# Patient Record
Sex: Male | Born: 1960 | Race: Black or African American | Hispanic: No | Marital: Single | Smoking: Former smoker
Health system: Southern US, Community
[De-identification: ages and names within clinical notes are randomized; demographics above are authoritative.]

## PROBLEM LIST (undated history)

## (undated) DIAGNOSIS — R269 Unspecified abnormalities of gait and mobility: Secondary | ICD-10-CM

## (undated) DIAGNOSIS — R569 Unspecified convulsions: Secondary | ICD-10-CM

## (undated) DIAGNOSIS — F1910 Other psychoactive substance abuse, uncomplicated: Secondary | ICD-10-CM

## (undated) DIAGNOSIS — I10 Essential (primary) hypertension: Secondary | ICD-10-CM

## (undated) DIAGNOSIS — K220 Achalasia of cardia: Secondary | ICD-10-CM

## (undated) DIAGNOSIS — G40109 Localization-related (focal) (partial) symptomatic epilepsy and epileptic syndromes with simple partial seizures, not intractable, without status epilepticus: Secondary | ICD-10-CM

## (undated) DIAGNOSIS — R1311 Dysphagia, oral phase: Secondary | ICD-10-CM

## (undated) DIAGNOSIS — B019 Varicella without complication: Secondary | ICD-10-CM

## (undated) DIAGNOSIS — Z21 Asymptomatic human immunodeficiency virus [HIV] infection status: Secondary | ICD-10-CM

## (undated) DIAGNOSIS — N62 Hypertrophy of breast: Secondary | ICD-10-CM

## (undated) DIAGNOSIS — Z8709 Personal history of other diseases of the respiratory system: Secondary | ICD-10-CM

## (undated) DIAGNOSIS — J309 Allergic rhinitis, unspecified: Secondary | ICD-10-CM

## (undated) DIAGNOSIS — S0990XA Unspecified injury of head, initial encounter: Secondary | ICD-10-CM

## (undated) DIAGNOSIS — E785 Hyperlipidemia, unspecified: Secondary | ICD-10-CM

## (undated) DIAGNOSIS — K219 Gastro-esophageal reflux disease without esophagitis: Secondary | ICD-10-CM

## (undated) DIAGNOSIS — J302 Other seasonal allergic rhinitis: Secondary | ICD-10-CM

## (undated) DIAGNOSIS — Z9889 Other specified postprocedural states: Secondary | ICD-10-CM

## (undated) DIAGNOSIS — S72009A Fracture of unspecified part of neck of unspecified femur, initial encounter for closed fracture: Secondary | ICD-10-CM

## (undated) DIAGNOSIS — B20 Human immunodeficiency virus [HIV] disease: Secondary | ICD-10-CM

## (undated) HISTORY — DX: Unspecified convulsions: R56.9

## (undated) HISTORY — DX: Unspecified injury of head, initial encounter: S09.90XA

## (undated) HISTORY — DX: Unspecified abnormalities of gait and mobility: R26.9

## (undated) HISTORY — DX: Gastro-esophageal reflux disease without esophagitis: K21.9

## (undated) HISTORY — DX: Localization-related (focal) (partial) symptomatic epilepsy and epileptic syndromes with simple partial seizures, not intractable, without status epilepticus: G40.109

## (undated) HISTORY — DX: Human immunodeficiency virus (HIV) disease: B20

## (undated) HISTORY — DX: Asymptomatic human immunodeficiency virus (hiv) infection status: Z21

## (undated) HISTORY — DX: Other psychoactive substance abuse, uncomplicated: F19.10

## (undated) HISTORY — DX: Varicella without complication: B01.9

## (undated) HISTORY — DX: Hyperlipidemia, unspecified: E78.5

## (undated) HISTORY — DX: Essential (primary) hypertension: I10

## (undated) HISTORY — DX: Dysphagia, oral phase: R13.11

## (undated) HISTORY — PX: COLONOSCOPY: SHX174

## (undated) HISTORY — DX: Other seasonal allergic rhinitis: J30.2

## (undated) HISTORY — PX: OTHER SURGICAL HISTORY: SHX169

## (undated) HISTORY — DX: Other specified postprocedural states: Z98.89

## (undated) HISTORY — DX: Fracture of unspecified part of neck of unspecified femur, initial encounter for closed fracture: S72.009A

## (undated) HISTORY — DX: Achalasia of cardia: K22.0

## (undated) HISTORY — DX: Personal history of other diseases of the respiratory system: Z87.09

## (undated) HISTORY — DX: Allergic rhinitis, unspecified: J30.9

## (undated) HISTORY — DX: Hypertrophy of breast: N62

---

## 1991-03-22 ENCOUNTER — Encounter (INDEPENDENT_AMBULATORY_CARE_PROVIDER_SITE_OTHER): Payer: Self-pay | Admitting: *Deleted

## 1991-03-22 LAB — CONVERTED CEMR LAB
CD4 Count: 916 microliters
CD4 T Cell Abs: 916

## 1998-04-04 ENCOUNTER — Encounter: Admission: RE | Admit: 1998-04-04 | Discharge: 1998-04-04 | Payer: Self-pay | Admitting: Internal Medicine

## 1998-04-23 ENCOUNTER — Encounter: Admission: RE | Admit: 1998-04-23 | Discharge: 1998-04-23 | Payer: Self-pay | Admitting: Internal Medicine

## 1998-07-05 ENCOUNTER — Encounter: Admission: RE | Admit: 1998-07-05 | Discharge: 1998-07-05 | Payer: Self-pay | Admitting: Internal Medicine

## 1998-07-23 ENCOUNTER — Encounter: Admission: RE | Admit: 1998-07-23 | Discharge: 1998-07-23 | Payer: Self-pay | Admitting: Internal Medicine

## 1998-08-28 ENCOUNTER — Encounter: Admission: RE | Admit: 1998-08-28 | Discharge: 1998-11-26 | Payer: Self-pay | Admitting: Dentistry

## 1998-10-08 ENCOUNTER — Ambulatory Visit (HOSPITAL_COMMUNITY): Admission: RE | Admit: 1998-10-08 | Discharge: 1998-10-08 | Payer: Self-pay | Admitting: Internal Medicine

## 1998-11-12 ENCOUNTER — Encounter: Admission: RE | Admit: 1998-11-12 | Discharge: 1998-11-12 | Payer: Self-pay | Admitting: Internal Medicine

## 1999-01-22 ENCOUNTER — Ambulatory Visit (HOSPITAL_COMMUNITY): Admission: RE | Admit: 1999-01-22 | Discharge: 1999-01-22 | Payer: Self-pay | Admitting: Internal Medicine

## 1999-01-22 ENCOUNTER — Encounter: Admission: RE | Admit: 1999-01-22 | Discharge: 1999-01-22 | Payer: Self-pay | Admitting: Internal Medicine

## 1999-02-05 ENCOUNTER — Encounter: Admission: RE | Admit: 1999-02-05 | Discharge: 1999-02-05 | Payer: Self-pay | Admitting: Internal Medicine

## 1999-04-22 ENCOUNTER — Ambulatory Visit (HOSPITAL_COMMUNITY): Admission: RE | Admit: 1999-04-22 | Discharge: 1999-04-22 | Payer: Self-pay | Admitting: Internal Medicine

## 1999-05-06 ENCOUNTER — Encounter: Admission: RE | Admit: 1999-05-06 | Discharge: 1999-05-06 | Payer: Self-pay | Admitting: Internal Medicine

## 1999-06-14 ENCOUNTER — Encounter (HOSPITAL_COMMUNITY): Admission: RE | Admit: 1999-06-14 | Discharge: 1999-07-22 | Payer: Self-pay | Admitting: Dentistry

## 1999-07-15 ENCOUNTER — Ambulatory Visit (HOSPITAL_COMMUNITY): Admission: RE | Admit: 1999-07-15 | Discharge: 1999-07-15 | Payer: Self-pay | Admitting: Internal Medicine

## 1999-07-15 ENCOUNTER — Encounter: Admission: RE | Admit: 1999-07-15 | Discharge: 1999-07-15 | Payer: Self-pay | Admitting: Internal Medicine

## 1999-08-05 ENCOUNTER — Encounter: Admission: RE | Admit: 1999-08-05 | Discharge: 1999-08-05 | Payer: Self-pay | Admitting: Internal Medicine

## 1999-09-03 ENCOUNTER — Encounter: Admission: RE | Admit: 1999-09-03 | Discharge: 1999-09-03 | Payer: Self-pay | Admitting: Internal Medicine

## 1999-09-17 ENCOUNTER — Encounter: Admission: RE | Admit: 1999-09-17 | Discharge: 1999-09-17 | Payer: Self-pay | Admitting: Internal Medicine

## 2000-01-08 ENCOUNTER — Encounter: Admission: RE | Admit: 2000-01-08 | Discharge: 2000-01-08 | Payer: Self-pay | Admitting: Internal Medicine

## 2000-01-08 ENCOUNTER — Ambulatory Visit (HOSPITAL_COMMUNITY): Admission: RE | Admit: 2000-01-08 | Discharge: 2000-01-08 | Payer: Self-pay | Admitting: Internal Medicine

## 2000-01-27 ENCOUNTER — Encounter: Admission: RE | Admit: 2000-01-27 | Discharge: 2000-01-27 | Payer: Self-pay | Admitting: Internal Medicine

## 2000-07-09 ENCOUNTER — Encounter: Admission: RE | Admit: 2000-07-09 | Discharge: 2000-07-09 | Payer: Self-pay | Admitting: Internal Medicine

## 2000-07-09 ENCOUNTER — Ambulatory Visit (HOSPITAL_COMMUNITY): Admission: RE | Admit: 2000-07-09 | Discharge: 2000-07-09 | Payer: Self-pay | Admitting: Internal Medicine

## 2000-07-27 ENCOUNTER — Encounter: Admission: RE | Admit: 2000-07-27 | Discharge: 2000-07-27 | Payer: Self-pay | Admitting: Internal Medicine

## 2000-12-23 ENCOUNTER — Encounter: Admission: RE | Admit: 2000-12-23 | Discharge: 2000-12-23 | Payer: Self-pay | Admitting: Internal Medicine

## 2000-12-23 ENCOUNTER — Ambulatory Visit (HOSPITAL_COMMUNITY): Admission: RE | Admit: 2000-12-23 | Discharge: 2000-12-23 | Payer: Self-pay | Admitting: Internal Medicine

## 2001-01-07 ENCOUNTER — Encounter: Admission: RE | Admit: 2001-01-07 | Discharge: 2001-01-07 | Payer: Self-pay | Admitting: Internal Medicine

## 2001-05-03 ENCOUNTER — Encounter: Admission: RE | Admit: 2001-05-03 | Discharge: 2001-05-03 | Payer: Self-pay | Admitting: Internal Medicine

## 2001-05-03 ENCOUNTER — Ambulatory Visit (HOSPITAL_COMMUNITY): Admission: RE | Admit: 2001-05-03 | Discharge: 2001-05-03 | Payer: Self-pay | Admitting: Internal Medicine

## 2001-05-18 ENCOUNTER — Encounter: Admission: RE | Admit: 2001-05-18 | Discharge: 2001-05-18 | Payer: Self-pay | Admitting: Internal Medicine

## 2001-08-17 ENCOUNTER — Encounter: Admission: RE | Admit: 2001-08-17 | Discharge: 2001-08-17 | Payer: Self-pay | Admitting: Internal Medicine

## 2001-08-17 ENCOUNTER — Ambulatory Visit (HOSPITAL_COMMUNITY): Admission: RE | Admit: 2001-08-17 | Discharge: 2001-08-17 | Payer: Self-pay | Admitting: Internal Medicine

## 2001-08-30 ENCOUNTER — Encounter: Admission: RE | Admit: 2001-08-30 | Discharge: 2001-08-30 | Payer: Self-pay | Admitting: Internal Medicine

## 2002-01-04 ENCOUNTER — Encounter: Payer: Self-pay | Admitting: Emergency Medicine

## 2002-01-04 ENCOUNTER — Inpatient Hospital Stay (HOSPITAL_COMMUNITY): Admission: AC | Admit: 2002-01-04 | Discharge: 2002-01-11 | Payer: Self-pay

## 2002-01-06 ENCOUNTER — Encounter (HOSPITAL_COMMUNITY): Payer: Self-pay | Admitting: Dentistry

## 2002-01-07 ENCOUNTER — Encounter: Payer: Self-pay | Admitting: Specialist

## 2002-04-11 ENCOUNTER — Ambulatory Visit (HOSPITAL_COMMUNITY): Admission: RE | Admit: 2002-04-11 | Discharge: 2002-04-11 | Payer: Self-pay | Admitting: Internal Medicine

## 2002-04-11 ENCOUNTER — Encounter: Admission: RE | Admit: 2002-04-11 | Discharge: 2002-04-11 | Payer: Self-pay | Admitting: Internal Medicine

## 2002-04-26 ENCOUNTER — Encounter: Admission: RE | Admit: 2002-04-26 | Discharge: 2002-04-26 | Payer: Self-pay | Admitting: Internal Medicine

## 2002-04-28 ENCOUNTER — Encounter (HOSPITAL_COMMUNITY): Admission: RE | Admit: 2002-04-28 | Discharge: 2002-07-27 | Payer: Self-pay | Admitting: Dentistry

## 2002-09-13 ENCOUNTER — Encounter: Admission: RE | Admit: 2002-09-13 | Discharge: 2002-09-13 | Payer: Self-pay | Admitting: Internal Medicine

## 2002-09-13 ENCOUNTER — Ambulatory Visit (HOSPITAL_COMMUNITY): Admission: RE | Admit: 2002-09-13 | Discharge: 2002-09-13 | Payer: Self-pay | Admitting: Internal Medicine

## 2002-09-27 ENCOUNTER — Encounter: Admission: RE | Admit: 2002-09-27 | Discharge: 2002-09-27 | Payer: Self-pay | Admitting: Internal Medicine

## 2003-01-12 ENCOUNTER — Encounter: Payer: Self-pay | Admitting: Emergency Medicine

## 2003-01-12 ENCOUNTER — Encounter: Payer: Self-pay | Admitting: Orthopedic Surgery

## 2003-01-12 ENCOUNTER — Inpatient Hospital Stay (HOSPITAL_COMMUNITY): Admission: EM | Admit: 2003-01-12 | Discharge: 2003-01-17 | Payer: Self-pay | Admitting: Emergency Medicine

## 2003-01-17 ENCOUNTER — Inpatient Hospital Stay (HOSPITAL_COMMUNITY)
Admission: RE | Admit: 2003-01-17 | Discharge: 2003-01-27 | Payer: Self-pay | Admitting: Physical Medicine & Rehabilitation

## 2003-03-03 ENCOUNTER — Encounter: Admission: RE | Admit: 2003-03-03 | Discharge: 2003-03-03 | Payer: Self-pay | Admitting: Internal Medicine

## 2003-03-21 ENCOUNTER — Encounter: Admission: RE | Admit: 2003-03-21 | Discharge: 2003-03-21 | Payer: Self-pay | Admitting: Infectious Diseases

## 2003-08-09 ENCOUNTER — Encounter: Admission: RE | Admit: 2003-08-09 | Discharge: 2003-08-09 | Payer: Self-pay | Admitting: Internal Medicine

## 2003-08-09 ENCOUNTER — Ambulatory Visit (HOSPITAL_COMMUNITY): Admission: RE | Admit: 2003-08-09 | Discharge: 2003-08-09 | Payer: Self-pay | Admitting: Internal Medicine

## 2003-08-09 ENCOUNTER — Encounter: Payer: Self-pay | Admitting: Internal Medicine

## 2003-09-05 ENCOUNTER — Encounter: Admission: RE | Admit: 2003-09-05 | Discharge: 2003-09-05 | Payer: Self-pay | Admitting: Internal Medicine

## 2003-09-19 ENCOUNTER — Encounter: Admission: RE | Admit: 2003-09-19 | Discharge: 2003-09-19 | Payer: Self-pay | Admitting: Gastroenterology

## 2003-09-19 ENCOUNTER — Encounter: Payer: Self-pay | Admitting: Gastroenterology

## 2003-11-28 ENCOUNTER — Ambulatory Visit (HOSPITAL_COMMUNITY): Admission: RE | Admit: 2003-11-28 | Discharge: 2003-11-28 | Payer: Self-pay | Admitting: Gastroenterology

## 2004-01-03 ENCOUNTER — Ambulatory Visit (HOSPITAL_COMMUNITY): Admission: RE | Admit: 2004-01-03 | Discharge: 2004-01-03 | Payer: Self-pay | Admitting: Gastroenterology

## 2004-02-06 ENCOUNTER — Ambulatory Visit (HOSPITAL_COMMUNITY): Admission: RE | Admit: 2004-02-06 | Discharge: 2004-02-06 | Payer: Self-pay | Admitting: Internal Medicine

## 2004-02-06 ENCOUNTER — Encounter: Admission: RE | Admit: 2004-02-06 | Discharge: 2004-02-06 | Payer: Self-pay | Admitting: Internal Medicine

## 2004-02-20 ENCOUNTER — Encounter: Admission: RE | Admit: 2004-02-20 | Discharge: 2004-02-20 | Payer: Self-pay | Admitting: Internal Medicine

## 2004-08-05 ENCOUNTER — Ambulatory Visit (HOSPITAL_COMMUNITY): Admission: RE | Admit: 2004-08-05 | Discharge: 2004-08-05 | Payer: Self-pay | Admitting: Internal Medicine

## 2004-08-05 ENCOUNTER — Encounter: Admission: RE | Admit: 2004-08-05 | Discharge: 2004-08-05 | Payer: Self-pay | Admitting: Internal Medicine

## 2004-08-22 ENCOUNTER — Ambulatory Visit: Payer: Self-pay | Admitting: Internal Medicine

## 2005-02-11 ENCOUNTER — Ambulatory Visit (HOSPITAL_COMMUNITY): Admission: RE | Admit: 2005-02-11 | Discharge: 2005-02-11 | Payer: Self-pay | Admitting: Internal Medicine

## 2005-02-11 ENCOUNTER — Ambulatory Visit: Payer: Self-pay | Admitting: Infectious Diseases

## 2005-02-25 ENCOUNTER — Ambulatory Visit: Payer: Self-pay | Admitting: Internal Medicine

## 2005-08-29 ENCOUNTER — Ambulatory Visit: Payer: Self-pay | Admitting: Internal Medicine

## 2005-08-29 ENCOUNTER — Ambulatory Visit (HOSPITAL_COMMUNITY): Admission: RE | Admit: 2005-08-29 | Discharge: 2005-08-29 | Payer: Self-pay | Admitting: Internal Medicine

## 2005-08-29 ENCOUNTER — Encounter (INDEPENDENT_AMBULATORY_CARE_PROVIDER_SITE_OTHER): Payer: Self-pay | Admitting: *Deleted

## 2005-08-29 LAB — CONVERTED CEMR LAB
CD4 Count: 730 uL
HIV 1 RNA Quant: 10600 {copies}/mL

## 2005-09-15 ENCOUNTER — Ambulatory Visit: Payer: Self-pay | Admitting: Internal Medicine

## 2006-03-04 ENCOUNTER — Encounter (INDEPENDENT_AMBULATORY_CARE_PROVIDER_SITE_OTHER): Payer: Self-pay | Admitting: *Deleted

## 2006-03-04 ENCOUNTER — Encounter: Admission: RE | Admit: 2006-03-04 | Discharge: 2006-03-04 | Payer: Self-pay | Admitting: Internal Medicine

## 2006-03-04 ENCOUNTER — Ambulatory Visit: Payer: Self-pay | Admitting: Internal Medicine

## 2006-03-04 LAB — CONVERTED CEMR LAB
CD4 Count: 630 microliters
HIV 1 RNA Quant: 11600 copies/mL

## 2006-03-19 ENCOUNTER — Ambulatory Visit: Payer: Self-pay | Admitting: Internal Medicine

## 2006-08-26 ENCOUNTER — Ambulatory Visit: Payer: Self-pay | Admitting: Internal Medicine

## 2006-08-26 ENCOUNTER — Encounter: Admission: RE | Admit: 2006-08-26 | Discharge: 2006-08-26 | Payer: Self-pay | Admitting: Internal Medicine

## 2006-08-26 ENCOUNTER — Encounter (INDEPENDENT_AMBULATORY_CARE_PROVIDER_SITE_OTHER): Payer: Self-pay | Admitting: *Deleted

## 2006-08-26 LAB — CONVERTED CEMR LAB
CD4 Count: 480 microliters
HIV 1 RNA Quant: 11200 copies/mL

## 2006-09-09 ENCOUNTER — Ambulatory Visit: Payer: Self-pay | Admitting: Internal Medicine

## 2007-02-05 DIAGNOSIS — G8190 Hemiplegia, unspecified affecting unspecified side: Secondary | ICD-10-CM | POA: Insufficient documentation

## 2007-02-05 DIAGNOSIS — K220 Achalasia of cardia: Secondary | ICD-10-CM

## 2007-02-05 DIAGNOSIS — B20 Human immunodeficiency virus [HIV] disease: Secondary | ICD-10-CM | POA: Insufficient documentation

## 2007-02-05 DIAGNOSIS — J309 Allergic rhinitis, unspecified: Secondary | ICD-10-CM

## 2007-02-05 DIAGNOSIS — R569 Unspecified convulsions: Secondary | ICD-10-CM

## 2007-02-05 DIAGNOSIS — S72009A Fracture of unspecified part of neck of unspecified femur, initial encounter for closed fracture: Secondary | ICD-10-CM

## 2007-02-05 DIAGNOSIS — F1010 Alcohol abuse, uncomplicated: Secondary | ICD-10-CM | POA: Insufficient documentation

## 2007-02-05 DIAGNOSIS — E785 Hyperlipidemia, unspecified: Secondary | ICD-10-CM

## 2007-02-05 DIAGNOSIS — R1311 Dysphagia, oral phase: Secondary | ICD-10-CM

## 2007-02-05 DIAGNOSIS — Z8709 Personal history of other diseases of the respiratory system: Secondary | ICD-10-CM

## 2007-02-05 DIAGNOSIS — I10 Essential (primary) hypertension: Secondary | ICD-10-CM

## 2007-02-05 DIAGNOSIS — N62 Hypertrophy of breast: Secondary | ICD-10-CM

## 2007-02-05 DIAGNOSIS — B029 Zoster without complications: Secondary | ICD-10-CM | POA: Insufficient documentation

## 2007-02-05 DIAGNOSIS — F17200 Nicotine dependence, unspecified, uncomplicated: Secondary | ICD-10-CM

## 2007-02-05 HISTORY — DX: Fracture of unspecified part of neck of unspecified femur, initial encounter for closed fracture: S72.009A

## 2007-02-05 HISTORY — DX: Dysphagia, oral phase: R13.11

## 2007-02-05 HISTORY — DX: Achalasia of cardia: K22.0

## 2007-02-05 HISTORY — DX: Hypertrophy of breast: N62

## 2007-02-05 HISTORY — DX: Allergic rhinitis, unspecified: J30.9

## 2007-02-05 HISTORY — DX: Personal history of other diseases of the respiratory system: Z87.09

## 2007-02-08 ENCOUNTER — Encounter: Payer: Self-pay | Admitting: Internal Medicine

## 2007-02-10 ENCOUNTER — Encounter: Admission: RE | Admit: 2007-02-10 | Discharge: 2007-02-10 | Payer: Self-pay | Admitting: Internal Medicine

## 2007-02-10 ENCOUNTER — Ambulatory Visit: Payer: Self-pay | Admitting: Internal Medicine

## 2007-02-10 LAB — CONVERTED CEMR LAB
ALT: 33 units/L (ref 0–53)
AST: 27 units/L (ref 0–37)
Albumin: 4 g/dL (ref 3.5–5.2)
Alkaline Phosphatase: 106 units/L (ref 39–117)
BUN: 11 mg/dL (ref 6–23)
Basophils Absolute: 0 10*3/uL (ref 0.0–0.1)
Basophils Relative: 1 % (ref 0–1)
CD4 % Helper T Cell: 24 %
CD4 Count: 610 microliters
CO2: 27 meq/L (ref 19–32)
Calcium: 8.7 mg/dL (ref 8.4–10.5)
Chloride: 102 meq/L (ref 96–112)
Cholesterol: 131 mg/dL (ref 0–200)
Creatinine, Ser: 0.86 mg/dL (ref 0.40–1.50)
Eosinophils Absolute: 0 10*3/uL (ref 0.0–0.7)
Eosinophils Relative: 0 % (ref 0–5)
Glucose, Bld: 196 mg/dL — ABNORMAL HIGH (ref 70–99)
HCT: 42.7 % (ref 39.0–52.0)
HDL: 31 mg/dL — ABNORMAL LOW (ref >39)
HIV 1 RNA Quant: 3360 copies/mL — ABNORMAL HIGH (ref <50)
HIV-1 RNA Quant, Log: 3.53 — ABNORMAL HIGH (ref <1.70)
Hemoglobin: 13.4 g/dL (ref 13.0–17.0)
LDL Cholesterol: 35 mg/dL (ref 0–99)
Lymphocytes Relative: 44 % (ref 12–46)
Lymphs Abs: 2.7 10*3/uL (ref 0.7–3.3)
MCHC: 31.4 g/dL (ref 30.0–36.0)
MCV: 87.3 fL (ref 78.0–100.0)
Monocytes Absolute: 0.6 10*3/uL (ref 0.2–0.7)
Monocytes Relative: 9 % (ref 3–11)
Neutro Abs: 2.9 10*3/uL (ref 1.7–7.7)
Neutrophils Relative %: 47 % (ref 43–77)
Platelets: 209 10*3/uL (ref 150–400)
Potassium: 3.7 meq/L (ref 3.5–5.3)
RBC: 4.89 M/uL (ref 4.22–5.81)
RDW: 14.7 % — ABNORMAL HIGH (ref 11.5–14.0)
Sodium: 137 meq/L (ref 135–145)
Total Bilirubin: 0.3 mg/dL (ref 0.3–1.2)
Total CHOL/HDL Ratio: 4.2
Total Protein: 8.7 g/dL — ABNORMAL HIGH (ref 6.0–8.3)
Triglycerides: 323 mg/dL — ABNORMAL HIGH (ref <150)
VLDL: 65 mg/dL — ABNORMAL HIGH (ref 0–40)
WBC: 6.3 10*3/uL (ref 4.0–10.5)

## 2007-02-15 ENCOUNTER — Encounter (INDEPENDENT_AMBULATORY_CARE_PROVIDER_SITE_OTHER): Payer: Self-pay | Admitting: *Deleted

## 2007-02-15 LAB — CONVERTED CEMR LAB

## 2007-02-25 ENCOUNTER — Ambulatory Visit: Payer: Self-pay | Admitting: Internal Medicine

## 2007-02-25 LAB — CONVERTED CEMR LAB: Hgb A1c MFr Bld: 6.1 %

## 2007-02-28 ENCOUNTER — Encounter (INDEPENDENT_AMBULATORY_CARE_PROVIDER_SITE_OTHER): Payer: Self-pay | Admitting: *Deleted

## 2007-03-02 ENCOUNTER — Encounter (INDEPENDENT_AMBULATORY_CARE_PROVIDER_SITE_OTHER): Payer: Self-pay | Admitting: *Deleted

## 2007-04-06 ENCOUNTER — Encounter: Payer: Self-pay | Admitting: Internal Medicine

## 2007-04-12 ENCOUNTER — Telehealth: Payer: Self-pay | Admitting: Internal Medicine

## 2007-05-03 ENCOUNTER — Encounter: Payer: Self-pay | Admitting: Internal Medicine

## 2007-05-04 ENCOUNTER — Ambulatory Visit: Payer: Self-pay | Admitting: Internal Medicine

## 2007-05-05 ENCOUNTER — Encounter: Payer: Self-pay | Admitting: Internal Medicine

## 2007-05-10 ENCOUNTER — Telehealth: Payer: Self-pay | Admitting: Internal Medicine

## 2007-09-03 ENCOUNTER — Ambulatory Visit: Payer: Self-pay | Admitting: Internal Medicine

## 2007-09-03 ENCOUNTER — Encounter: Admission: RE | Admit: 2007-09-03 | Discharge: 2007-09-03 | Payer: Self-pay | Admitting: Internal Medicine

## 2007-09-03 LAB — CONVERTED CEMR LAB
HIV 1 RNA Quant: 12500 copies/mL — ABNORMAL HIGH (ref <50)
HIV-1 RNA Quant, Log: 4.1 — ABNORMAL HIGH (ref <1.70)

## 2007-09-28 ENCOUNTER — Ambulatory Visit: Payer: Self-pay | Admitting: Internal Medicine

## 2007-09-28 DIAGNOSIS — K219 Gastro-esophageal reflux disease without esophagitis: Secondary | ICD-10-CM

## 2007-09-28 HISTORY — DX: Gastro-esophageal reflux disease without esophagitis: K21.9

## 2007-09-28 LAB — CONVERTED CEMR LAB
BUN: 15 mg/dL (ref 6–23)
CO2: 27 meq/L (ref 19–32)
Calcium: 8.9 mg/dL (ref 8.4–10.5)
Chloride: 102 meq/L (ref 96–112)
Creatinine, Ser: 0.88 mg/dL (ref 0.40–1.50)
Glucose, Bld: 104 mg/dL — ABNORMAL HIGH (ref 70–99)
Hgb A1c MFr Bld: 5.8 %
Potassium: 3.7 meq/L (ref 3.5–5.3)
Sodium: 138 meq/L (ref 135–145)

## 2007-12-21 ENCOUNTER — Encounter: Payer: Self-pay | Admitting: Internal Medicine

## 2008-03-09 ENCOUNTER — Ambulatory Visit: Payer: Self-pay | Admitting: Internal Medicine

## 2008-03-09 ENCOUNTER — Encounter: Admission: RE | Admit: 2008-03-09 | Discharge: 2008-03-09 | Payer: Self-pay | Admitting: Internal Medicine

## 2008-03-09 LAB — CONVERTED CEMR LAB
ALT: 29 units/L (ref 0–53)
AST: 27 units/L (ref 0–37)
Albumin: 3.8 g/dL (ref 3.5–5.2)
Alkaline Phosphatase: 88 units/L (ref 39–117)
BUN: 15 mg/dL (ref 6–23)
CO2: 25 meq/L (ref 19–32)
Calcium: 9 mg/dL (ref 8.4–10.5)
Chloride: 104 meq/L (ref 96–112)
Cholesterol: 141 mg/dL (ref 0–200)
Creatinine, Ser: 0.88 mg/dL (ref 0.40–1.50)
Glucose, Bld: 92 mg/dL (ref 70–99)
HCT: 41.2 % (ref 39.0–52.0)
HDL: 36 mg/dL — ABNORMAL LOW (ref >39)
HIV 1 RNA Quant: 7800 copies/mL — ABNORMAL HIGH (ref <50)
HIV-1 RNA Quant, Log: 3.89 — ABNORMAL HIGH (ref <1.70)
Hemoglobin: 13 g/dL (ref 13.0–17.0)
LDL Cholesterol: 59 mg/dL (ref 0–99)
MCHC: 31.6 g/dL (ref 30.0–36.0)
MCV: 86.9 fL (ref 78.0–100.0)
Platelets: 209 10*3/uL (ref 150–400)
Potassium: 3.7 meq/L (ref 3.5–5.3)
RBC: 4.74 M/uL (ref 4.22–5.81)
RDW: 14.7 % (ref 11.5–15.5)
Sodium: 139 meq/L (ref 135–145)
Total Bilirubin: 0.3 mg/dL (ref 0.3–1.2)
Total CHOL/HDL Ratio: 3.9
Total Protein: 8.7 g/dL — ABNORMAL HIGH (ref 6.0–8.3)
Triglycerides: 229 mg/dL — ABNORMAL HIGH (ref <150)
VLDL: 46 mg/dL — ABNORMAL HIGH (ref 0–40)
WBC: 7.7 10*3/uL (ref 4.0–10.5)

## 2008-03-22 ENCOUNTER — Ambulatory Visit: Payer: Self-pay | Admitting: Internal Medicine

## 2008-09-04 ENCOUNTER — Ambulatory Visit: Payer: Self-pay | Admitting: Internal Medicine

## 2008-09-04 LAB — CONVERTED CEMR LAB
BUN: 18 mg/dL (ref 6–23)
CO2: 25 meq/L (ref 19–32)
Calcium: 9.2 mg/dL (ref 8.4–10.5)
Chloride: 103 meq/L (ref 96–112)
Creatinine, Ser: 0.89 mg/dL (ref 0.40–1.50)
Glucose, Bld: 83 mg/dL (ref 70–99)
HIV 1 RNA Quant: 8410 copies/mL — ABNORMAL HIGH (ref <50)
HIV-1 RNA Quant, Log: 3.92 — ABNORMAL HIGH (ref <1.70)
Potassium: 3.9 meq/L (ref 3.5–5.3)
Sodium: 138 meq/L (ref 135–145)

## 2008-09-19 ENCOUNTER — Ambulatory Visit: Payer: Self-pay | Admitting: Internal Medicine

## 2009-01-02 ENCOUNTER — Encounter: Payer: Self-pay | Admitting: Internal Medicine

## 2009-01-17 ENCOUNTER — Telehealth (INDEPENDENT_AMBULATORY_CARE_PROVIDER_SITE_OTHER): Payer: Self-pay | Admitting: *Deleted

## 2009-01-19 ENCOUNTER — Encounter: Payer: Self-pay | Admitting: Internal Medicine

## 2009-01-19 ENCOUNTER — Ambulatory Visit: Payer: Self-pay | Admitting: Internal Medicine

## 2009-02-12 ENCOUNTER — Emergency Department (HOSPITAL_COMMUNITY): Admission: EM | Admit: 2009-02-12 | Discharge: 2009-02-12 | Payer: Self-pay | Admitting: Emergency Medicine

## 2009-02-28 ENCOUNTER — Encounter: Admission: RE | Admit: 2009-02-28 | Discharge: 2009-02-28 | Payer: Self-pay | Admitting: Gastroenterology

## 2009-02-28 ENCOUNTER — Encounter: Payer: Self-pay | Admitting: Internal Medicine

## 2009-03-05 ENCOUNTER — Ambulatory Visit: Payer: Self-pay | Admitting: Internal Medicine

## 2009-03-05 ENCOUNTER — Telehealth: Payer: Self-pay | Admitting: Internal Medicine

## 2009-03-05 LAB — CONVERTED CEMR LAB
ALT: 21 units/L (ref 0–53)
AST: 21 units/L (ref 0–37)
Albumin: 3.6 g/dL (ref 3.5–5.2)
Alkaline Phosphatase: 77 units/L (ref 39–117)
BUN: 14 mg/dL (ref 6–23)
Basophils Absolute: 0 10*3/uL (ref 0.0–0.1)
Basophils Relative: 0 % (ref 0–1)
CO2: 26 meq/L (ref 19–32)
Calcium: 9 mg/dL (ref 8.4–10.5)
Chloride: 104 meq/L (ref 96–112)
Cholesterol: 131 mg/dL (ref 0–200)
Creatinine, Ser: 0.83 mg/dL (ref 0.40–1.50)
Eosinophils Absolute: 0 10*3/uL (ref 0.0–0.7)
Eosinophils Relative: 0 % (ref 0–5)
Glucose, Bld: 81 mg/dL (ref 70–99)
HCT: 39.4 % (ref 39.0–52.0)
HDL: 44 mg/dL (ref >39)
HIV 1 RNA Quant: 8110 copies/mL — ABNORMAL HIGH (ref <48)
HIV-1 RNA Quant, Log: 3.91 — ABNORMAL HIGH (ref <1.68)
Hemoglobin: 12.8 g/dL — ABNORMAL LOW (ref 13.0–17.0)
LDL Cholesterol: 65 mg/dL (ref 0–99)
Lymphocytes Relative: 32 % (ref 12–46)
Lymphs Abs: 2.9 10*3/uL (ref 0.7–4.0)
MCHC: 32.5 g/dL (ref 30.0–36.0)
MCV: 84.7 fL (ref 78.0–100.0)
Monocytes Absolute: 0.8 10*3/uL (ref 0.1–1.0)
Monocytes Relative: 9 % (ref 3–12)
Neutro Abs: 5.4 10*3/uL (ref 1.7–7.7)
Neutrophils Relative %: 60 % (ref 43–77)
Platelets: 229 10*3/uL (ref 150–400)
Potassium: 3.8 meq/L (ref 3.5–5.3)
RBC: 4.65 M/uL (ref 4.22–5.81)
RDW: 14.2 % (ref 11.5–15.5)
Sodium: 139 meq/L (ref 135–145)
Total Bilirubin: 0.3 mg/dL (ref 0.3–1.2)
Total CHOL/HDL Ratio: 3
Total Protein: 8.3 g/dL (ref 6.0–8.3)
Triglycerides: 112 mg/dL (ref <150)
VLDL: 22 mg/dL (ref 0–40)
WBC: 9.1 10*3/uL (ref 4.0–10.5)

## 2009-03-06 ENCOUNTER — Encounter: Payer: Self-pay | Admitting: Internal Medicine

## 2009-03-20 ENCOUNTER — Ambulatory Visit: Payer: Self-pay | Admitting: Internal Medicine

## 2009-03-20 LAB — CONVERTED CEMR LAB: Phenytoin Lvl: <0.5 ug/mL — ABNORMAL LOW (ref 10.0–20.0)

## 2009-03-22 ENCOUNTER — Encounter: Payer: Self-pay | Admitting: Internal Medicine

## 2009-03-28 ENCOUNTER — Ambulatory Visit (HOSPITAL_COMMUNITY): Admission: RE | Admit: 2009-03-28 | Discharge: 2009-03-28 | Payer: Self-pay | Admitting: Gastroenterology

## 2009-04-23 ENCOUNTER — Ambulatory Visit (HOSPITAL_COMMUNITY): Admission: RE | Admit: 2009-04-23 | Discharge: 2009-04-23 | Payer: Self-pay | Admitting: Gastroenterology

## 2009-06-05 ENCOUNTER — Ambulatory Visit: Payer: Self-pay | Admitting: Internal Medicine

## 2009-06-05 LAB — CONVERTED CEMR LAB
HIV 1 RNA Quant: 13800 copies/mL — ABNORMAL HIGH (ref <48)
HIV-1 RNA Quant, Log: 4.14 — ABNORMAL HIGH (ref <1.68)

## 2009-06-14 ENCOUNTER — Encounter: Payer: Self-pay | Admitting: Internal Medicine

## 2009-06-21 ENCOUNTER — Ambulatory Visit: Payer: Self-pay | Admitting: Internal Medicine

## 2009-08-21 ENCOUNTER — Inpatient Hospital Stay (HOSPITAL_COMMUNITY): Admission: RE | Admit: 2009-08-21 | Discharge: 2009-08-25 | Payer: Self-pay | Admitting: General Surgery

## 2009-08-21 HISTORY — PX: ESOPHAGUS SURGERY: SHX626

## 2009-09-19 ENCOUNTER — Encounter: Payer: Self-pay | Admitting: Internal Medicine

## 2009-10-10 ENCOUNTER — Encounter: Payer: Self-pay | Admitting: Internal Medicine

## 2009-10-18 ENCOUNTER — Encounter: Payer: Self-pay | Admitting: Internal Medicine

## 2010-01-15 ENCOUNTER — Ambulatory Visit: Payer: Self-pay | Admitting: Internal Medicine

## 2010-01-15 LAB — CONVERTED CEMR LAB
ALT: 23 units/L (ref 0–53)
AST: 27 units/L (ref 0–37)
Albumin: 3.9 g/dL (ref 3.5–5.2)
Alkaline Phosphatase: 67 units/L (ref 39–117)
BUN: 17 mg/dL (ref 6–23)
Basophils Absolute: 0 10*3/uL (ref 0.0–0.1)
Basophils Relative: 1 % (ref 0–1)
CO2: 30 meq/L (ref 19–32)
Calcium: 8.6 mg/dL (ref 8.4–10.5)
Chloride: 103 meq/L (ref 96–112)
Cholesterol: 170 mg/dL (ref 0–200)
Creatinine, Ser: 0.8 mg/dL (ref 0.40–1.50)
Eosinophils Absolute: 0 10*3/uL (ref 0.0–0.7)
Eosinophils Relative: 1 % (ref 0–5)
Glucose, Bld: 101 mg/dL — ABNORMAL HIGH (ref 70–99)
HCT: 37.7 % — ABNORMAL LOW (ref 39.0–52.0)
HDL: 41 mg/dL (ref >39)
HIV 1 RNA Quant: 29100 copies/mL — ABNORMAL HIGH (ref <48)
HIV-1 RNA Quant, Log: 4.46 — ABNORMAL HIGH (ref <1.68)
Hemoglobin: 12.2 g/dL — ABNORMAL LOW (ref 13.0–17.0)
LDL Cholesterol: 77 mg/dL (ref 0–99)
Lymphocytes Relative: 56 % — ABNORMAL HIGH (ref 12–46)
Lymphs Abs: 2.3 10*3/uL (ref 0.7–4.0)
MCHC: 32.4 g/dL (ref 30.0–36.0)
MCV: 86.1 fL (ref >=78.0)
Monocytes Absolute: 0.4 10*3/uL (ref 0.1–1.0)
Monocytes Relative: 9 % (ref 3–12)
Neutro Abs: 1.4 10*3/uL — ABNORMAL LOW (ref 1.7–7.7)
Neutrophils Relative %: 34 % — ABNORMAL LOW (ref 43–77)
Platelets: 170 10*3/uL (ref 150–400)
Potassium: 3.8 meq/L (ref 3.5–5.3)
RBC: 4.38 M/uL (ref 4.22–5.81)
RDW: 13.6 % (ref 11.5–15.5)
Sodium: 140 meq/L (ref 135–145)
Total Bilirubin: 0.2 mg/dL — ABNORMAL LOW (ref 0.3–1.2)
Total CHOL/HDL Ratio: 4.1
Total Protein: 8.3 g/dL (ref 6.0–8.3)
Triglycerides: 261 mg/dL — ABNORMAL HIGH (ref <150)
VLDL: 52 mg/dL — ABNORMAL HIGH (ref 0–40)
WBC: 4.1 10*3/uL (ref 4.0–10.5)

## 2010-02-07 ENCOUNTER — Encounter: Payer: Self-pay | Admitting: Internal Medicine

## 2010-02-13 ENCOUNTER — Encounter: Payer: Self-pay | Admitting: Internal Medicine

## 2010-03-05 ENCOUNTER — Ambulatory Visit: Payer: Self-pay | Admitting: Internal Medicine

## 2010-03-05 ENCOUNTER — Encounter: Admission: RE | Admit: 2010-03-05 | Discharge: 2010-03-06 | Payer: Self-pay | Admitting: Internal Medicine

## 2010-03-05 DIAGNOSIS — Z9889 Other specified postprocedural states: Secondary | ICD-10-CM

## 2010-03-05 HISTORY — DX: Other specified postprocedural states: Z98.89

## 2010-03-18 ENCOUNTER — Telehealth (INDEPENDENT_AMBULATORY_CARE_PROVIDER_SITE_OTHER): Payer: Self-pay | Admitting: *Deleted

## 2010-08-19 ENCOUNTER — Encounter: Payer: Self-pay | Admitting: Internal Medicine

## 2010-08-23 ENCOUNTER — Ambulatory Visit: Payer: Self-pay | Admitting: Internal Medicine

## 2010-08-23 LAB — CONVERTED CEMR LAB
HIV 1 RNA Quant: 18400 copies/mL — ABNORMAL HIGH (ref <48)
HIV-1 RNA Quant, Log: 4.26 — ABNORMAL HIGH (ref <1.68)

## 2010-09-09 ENCOUNTER — Encounter (INDEPENDENT_AMBULATORY_CARE_PROVIDER_SITE_OTHER): Payer: Self-pay | Admitting: *Deleted

## 2011-01-21 NOTE — Miscellaneous (Signed)
Summary: Ensure rx for THP  Clinical Lists Changes  Medications: Added new medication of ENSURE  LIQD (NUTRITIONAL SUPPLEMENTS) 2 cans per day - Signed Rx of ENSURE  LIQD (NUTRITIONAL SUPPLEMENTS) 2 cans per day;  #2 cases x prn;  Signed;  Entered by: Jennet Maduro RN;  Authorized by: Cliffton Asters MD;  Method used: Print then Give to Patient    Prescriptions: ENSURE  LIQD (NUTRITIONAL SUPPLEMENTS) 2 cans per day  #2 cases x prn   Entered by:   Jennet Maduro RN   Authorized by:   Cliffton Asters MD   Signed by:   Jennet Maduro RN on 09/09/2010   Method used:   Print then Give to Patient   RxID:   [id number]

## 2011-01-21 NOTE — Assessment & Plan Note (Signed)
Summary: f/u, eval for power chair /dde   CC:  follow-up visit and surgery very effective.  History of Present Illness: Tristan [name] is in for his routine visit.  He underwent successful laparoscopic myotomy and fundoplication last August and has had dramatic improvement in his swallowing difficulties.  He denies any difficulty swallowing now says that his acid indigestion symptoms are markedly improved.  He recalls having one seizure while he was in the hospital in his IV infiltrated and he was not able to get his seizure medications.  He is happy to be regaining his weight.  He is not in a relationship and has not been sexually active in several years.  Preventive Screening-Counseling & Management  Alcohol-Tobacco     Alcohol drinks/day: 0     Smoking Status: quit     Year Quit: 1998     Pack years: 12  Caffeine-Diet-Exercise     Caffeine use/day: no     Does Patient Exercise: yes     Type of exercise: left side exercising by pt at home     Exercise (avg: min/session): 30-60  Hep-HIV-STD-Contraception     HIV Risk: no risk noted     HIV Risk Counseling: not indicated-no HIV risk noted     STD Risk: no risk noted  Safety-Violence-Falls     Seat Belt Use: yes  Comments: decoined condoms      Sexual History:  n/a.        Drug Use:  former.     Prior Medication List:  HYDROCHLOROTHIAZIDE 50 MG TABS (HYDROCHLOROTHIAZIDE) Take 1 tablet by mouth once a day DILANTIN 100 MG CAPS (PHENYTOIN SODIUM EXTENDED) Take 4 capsules by mouth once a day KEPPRA 250 MG TABS (LEVETIRACETAM) Take 1/2 tablet by mouth two times a day MAALOX 600 MG CHEW (CALCIUM CARBONATE ANTACID) as needed   Current Allergies (reviewed today): No known allergies  Social History: Sexual History:  n/a Drug Use:  former  Vital Signs:  Patient profile:   50 year old male Height:      75 inches (190.50 cm) Weight:      171.1 pounds (77.77 kg) BMI:     21.46 Temp:     97.8 degrees F (36.56 degrees C) oral Pulse  rate:   85 / minute BP sitting:   119 / 73  (left arm) Cuff size:   large  Vitals Entered By: Tristan Maduro RN (March 05, 2010 10:27 AM) CC: follow-up visit, surgery very effective Is Patient Diabetic? No Pain Assessment Patient in pain? no      Nutritional Status BMI of 19 -24 = normal Nutritional Status Detail appetite "great"  Have you ever been in a relationship where you felt threatened, hurt or afraid?No   Does patient need assistance? Functional Status Self care Ambulation Normal   Physical Exam  General:  alert and well-nourished.  he has gained 9 pounds since his last visit. Mouth:  pharynx pink and moist.  good dentition, no erythema, and no exudates.   Lungs:  normal breath sounds.  no crackles and no wheezes.   Heart:  normal rate, regular rhythm, and no murmur.   Psych:  normally interactive, good eye contact, not anxious appearing, and not depressed appearing.          Medication Adherence: 03/05/2010   Adherence to medications reviewed with patient. Counseling to provide adequate adherence provided   Prevention For Positives: 03/05/2010   Safe sex practices discussed with patient. Condoms offered.  Impression & Recommendations:  Problem # 1:  HIV DISEASE (ICD-042) His HIV infection remains asymptomatic.  I will continue observation off of antiretroviral therapy. Diagnostics Reviewed:  HIV: HIV positive - not AIDS (09/19/2008)   CD4: 740 (01/16/2010)   CD4 %: 24 (02/10/2007) WBC: 4.1 (01/15/2010)   Hgb: 12.2 (01/15/2010)   HCT: 37.7 (01/15/2010)   Platelets: 170 (01/15/2010) HIV-1 RNA: 29100 (01/15/2010)   HBSAg: No (02/15/2007)  Other Orders: Est. Patient Level III (16109) Future Orders: T-CD4SP ([facility]) (CD4SP) ... 09/01/2010 T-HIV Viral Load [phone]) ... 09/01/2010  Patient Instructions: 1)  Please schedule a follow-up appointment in 6 months.  Process Orders Check Orders Results:     [facility]: Check successful Tests Sent for requisitioning (March 05, 2010 10:43 AM):     09/01/2010: [facility] -- T-HIV Viral Load [phone] (signed)         Medication Adherence: 03/05/2010   Adherence to medications reviewed with patient. Counseling to provide adequate adherence provided    Prevention For Positives: 03/05/2010   Safe sex practices discussed with patient. Condoms offered.                            Appended Document: f/u, eval for power chair /dde Flu Vaccine Consent Questions     Do you have a history of severe allergic reactions to this vaccine? no    Any prior history of allergic reactions to egg and/or gelatin? no    Do you have a sensitivity to the preservative Thimersol? no    Do you have a past history of Guillan-Barre Syndrome? no    Do you currently have an acute febrile illness? no    Have you ever had a severe reaction to latex? no    Vaccine information given and explained to patient? yes    Are you currently pregnant? no    Lot [id number] p   Exp Date:03/22/2010   Manufacturer: [employer]    Site Given  Left Deltoid IM Tristan Maduro RN  March 05, 2010 10:56 AM

## 2011-01-21 NOTE — Miscellaneous (Signed)
Summary: [facility]: [facility] Den[facility]  [facility]: [facility]   Imported By: Florinda Marker 08/19/2010 15:32:38  _____________________________________________________________________  External Attachment:    Type:   Image     Comment:   External Document

## 2011-01-21 NOTE — Progress Notes (Signed)
Summary: Please clarify dosage  Phone Note Call from Patient   Caller: Patient Details for Reason: clarification on mg for Keppra Summary of Call: Patient called stating that he thought he was taking Keppra 500mg  .  Does not understand why Orvan Falconer is prescribing this.  He thought the neuroligist  was  prescribing this. Patient also left message with his neuroligist.  Please call patient back at [phone] Initial call taken by: Paulo Fruit  BS,CPht II,MPH,  March 18, 2010 10:57 AM  Follow-up for Phone Call        [name] is correct. His neurologist is responsible for Keppra. Follow-up by: Cliffton Asters MD,  March 18, 2010 3:55 PM     Appended Document: Please clarify dosage left message for patient to call office at [phone]

## 2011-01-21 NOTE — Miscellaneous (Signed)
Summary: [facility]  [facility]   Imported By: Florinda Marker 02/13/2010 14:20:55  _____________________________________________________________________  External Attachment:    Type:   Image     Comment:   External Document

## 2011-01-21 NOTE — Miscellaneous (Signed)
Summary: Mobility Evaluation Referral  Clinical Lists Changes  Orders: Added new Referral order of Rehabilitation Referral (Rehab) - Signed

## 2011-02-18 ENCOUNTER — Encounter: Payer: Self-pay | Admitting: Internal Medicine

## 2011-02-18 ENCOUNTER — Other Ambulatory Visit: Payer: Self-pay

## 2011-02-20 ENCOUNTER — Encounter: Payer: Self-pay | Admitting: Internal Medicine

## 2011-02-27 NOTE — Miscellaneous (Signed)
Summary: labs  Clinical Lists Changes  Orders: Added new Test order of T-Lipid Profile [phone]) - Signed Added new Test order of T-CBC w/Diff [id number]) - Signed Added new Test order of T-CD4SP [facility]) (CD4SP) - Signed Added new Test order of T-Comprehensive Metabolic Panel [id number]) - Signed Added new Test order of T-HIV Viral Load [id number]) - Signed Added new Test order of T-RPR (Syphilis) [phone]) - Signed     Process Orders Check Orders Results:     [facility]: Check successful Order queued for requisitioning for [facility]: February 18, 2011 9:32 AM  Tests Sent for requisitioning (February 18, 2011 9:32 AM):     02/18/2011: [facility] -- T-Lipid Profile [phone] (signed)     02/18/2011: [facility] -- T-CBC w/Diff [[id number]] (signed)     02/18/2011: [facility] -- T-Comprehensive Metabolic Panel [[id number]] (signed)     02/18/2011: [facility] -- T-HIV Viral Load [id number] (signed)     02/18/2011: [facility] -- T-RPR (Syphilis) [phone] (signed)

## 2011-03-04 ENCOUNTER — Ambulatory Visit: Payer: Self-pay | Admitting: Internal Medicine

## 2011-03-06 LAB — T-HELPER CELL (CD4) - (RCID CLINIC ONLY)
CD4 % Helper T Cell: 24 % — ABNORMAL LOW (ref 33–55)
CD4 T Cell Abs: 450 uL (ref 400–2700)

## 2011-03-10 LAB — T-HELPER CELL (CD4) - (RCID CLINIC ONLY)
CD4 % Helper T Cell: 35 % (ref 33–55)
CD4 T Cell Abs: 740 uL (ref 400–2700)

## 2011-03-11 NOTE — Consult Note (Signed)
Summary: [facility]   [facility]   Imported By: Florinda Marker 03/05/2011 14:52:28  _____________________________________________________________________  External Attachment:    Type:   Image     Comment:   External Document

## 2011-03-17 ENCOUNTER — Other Ambulatory Visit: Payer: Medicare Other

## 2011-03-17 DIAGNOSIS — B20 Human immunodeficiency virus [HIV] disease: Secondary | ICD-10-CM

## 2011-03-17 DIAGNOSIS — Z79899 Other long term (current) drug therapy: Secondary | ICD-10-CM

## 2011-03-17 DIAGNOSIS — Z113 Encounter for screening for infections with a predominantly sexual mode of transmission: Secondary | ICD-10-CM

## 2011-03-17 LAB — CBC WITH DIFFERENTIAL/PLATELET
Basophils Absolute: 0 10*3/uL (ref 0.0–0.1)
Basophils Relative: 0 % (ref 0–1)
Eosinophils Absolute: 0 10*3/uL (ref 0.0–0.7)
Eosinophils Relative: 0 % (ref 0–5)
HCT: 38.6 % — ABNORMAL LOW (ref 39.0–52.0)
Hemoglobin: 12.6 g/dL — ABNORMAL LOW (ref 13.0–17.0)
Lymphocytes Relative: 44 % (ref 12–46)
Lymphs Abs: 2.4 10*3/uL (ref 0.7–4.0)
MCH: 27.8 pg (ref 26.0–34.0)
MCHC: 32.6 g/dL (ref 30.0–36.0)
MCV: 85.2 fL (ref 78.0–100.0)
Monocytes Absolute: 0.5 10*3/uL (ref 0.1–1.0)
Monocytes Relative: 8 % (ref 3–12)
Neutro Abs: 2.5 10*3/uL (ref 1.7–7.7)
Neutrophils Relative %: 47 % (ref 43–77)
Platelets: 168 10*3/uL (ref 150–400)
RBC: 4.53 MIL/uL (ref 4.22–5.81)
RDW: 14.1 % (ref 11.5–15.5)
WBC: 5.4 10*3/uL (ref 4.0–10.5)

## 2011-03-17 LAB — LIPID PANEL
Cholesterol: 194 mg/dL (ref 0–200)
HDL: 46 mg/dL (ref >39)
LDL Cholesterol: 123 mg/dL — ABNORMAL HIGH (ref 0–99)
Total CHOL/HDL Ratio: 4.2 Ratio
Triglycerides: 127 mg/dL (ref <150)
VLDL: 25 mg/dL (ref 0–40)

## 2011-03-18 LAB — T-HELPER CELL (CD4) - (RCID CLINIC ONLY)
CD4 % Helper T Cell: 23 % — ABNORMAL LOW (ref 33–55)
CD4 T Cell Abs: 540 uL (ref 400–2700)

## 2011-03-18 LAB — COMPLETE METABOLIC PANEL WITH GFR
ALT: 21 U/L (ref 0–53)
AST: 26 U/L (ref 0–37)
Albumin: 4 g/dL (ref 3.5–5.2)
Alkaline Phosphatase: 80 U/L (ref 39–117)
BUN: 13 mg/dL (ref 6–23)
CO2: 29 mEq/L (ref 19–32)
Calcium: 9.2 mg/dL (ref 8.4–10.5)
Chloride: 103 mEq/L (ref 96–112)
Creat: 0.78 mg/dL (ref 0.40–1.50)
GFR, Est African American: >60 mL/min (ref >60)
GFR, Est Non African American: >60 mL/min (ref >60)
Glucose, Bld: 73 mg/dL (ref 70–99)
Potassium: 3.4 mEq/L — ABNORMAL LOW (ref 3.5–5.3)
Sodium: 140 mEq/L (ref 135–145)
Total Bilirubin: 0.2 mg/dL — ABNORMAL LOW (ref 0.3–1.2)
Total Protein: 8.2 g/dL (ref 6.0–8.3)

## 2011-03-18 LAB — RPR

## 2011-03-18 LAB — HIV-1 RNA QUANT-NO REFLEX-BLD
HIV 1 RNA Quant: 27500 copies/mL — ABNORMAL HIGH (ref <20)
HIV-1 RNA Quant, Log: 4.44 {Log} — ABNORMAL HIGH (ref <1.30)

## 2011-03-26 ENCOUNTER — Other Ambulatory Visit: Payer: Self-pay | Admitting: Internal Medicine

## 2011-03-26 DIAGNOSIS — I10 Essential (primary) hypertension: Secondary | ICD-10-CM

## 2011-03-29 LAB — TYPE AND SCREEN
ABO/RH(D): O POS
Antibody Screen: NEGATIVE

## 2011-03-29 LAB — DIFFERENTIAL
Basophils Relative: 1 % (ref 0–1)
Eosinophils Absolute: 0 10*3/uL (ref 0.0–0.7)
Eosinophils Relative: 0 % (ref 0–5)
Lymphs Abs: 1.9 10*3/uL (ref 0.7–4.0)
Monocytes Relative: 8 % (ref 3–12)

## 2011-03-29 LAB — PROTIME-INR
INR: 1.1 (ref 0.00–1.49)
Prothrombin Time: 14.1 seconds (ref 11.6–15.2)

## 2011-03-29 LAB — CBC
HCT: 36.9 % — ABNORMAL LOW (ref 39.0–52.0)
Hemoglobin: 12.3 g/dL — ABNORMAL LOW (ref 13.0–17.0)
MCHC: 33.2 g/dL (ref 30.0–36.0)
MCV: 86.1 fL (ref 78.0–100.0)
Platelets: 150 10*3/uL (ref 150–400)
RBC: 4.29 MIL/uL (ref 4.22–5.81)
RDW: 14.1 % (ref 11.5–15.5)
WBC: 5.4 10*3/uL (ref 4.0–10.5)

## 2011-03-29 LAB — COMPREHENSIVE METABOLIC PANEL
ALT: 27 U/L (ref 0–53)
AST: 25 U/L (ref 0–37)
Alkaline Phosphatase: 95 U/L (ref 39–117)
CO2: 34 mEq/L — ABNORMAL HIGH (ref 19–32)
Calcium: 9.2 mg/dL (ref 8.4–10.5)
GFR calc Af Amer: >60 mL/min (ref >60)
Glucose, Bld: 75 mg/dL (ref 70–99)
Potassium: 4 mEq/L (ref 3.5–5.1)
Sodium: 140 mEq/L (ref 135–145)
Total Protein: 8.7 g/dL — ABNORMAL HIGH (ref 6.0–8.3)

## 2011-03-29 LAB — ABO/RH: ABO/RH(D): O POS

## 2011-03-31 ENCOUNTER — Ambulatory Visit (INDEPENDENT_AMBULATORY_CARE_PROVIDER_SITE_OTHER): Payer: Medicare Other | Admitting: Internal Medicine

## 2011-03-31 ENCOUNTER — Encounter: Payer: Self-pay | Admitting: Internal Medicine

## 2011-03-31 VITALS — BP 146/82 | HR 73 | Temp 97.6°F | Ht 75.0 in | Wt 171.5 lb

## 2011-03-31 DIAGNOSIS — B20 Human immunodeficiency virus [HIV] disease: Secondary | ICD-10-CM

## 2011-03-31 NOTE — Assessment & Plan Note (Signed)
Tristan [name]'s HIV remains asymptomatic and stable. I will continue observation off antiretrovirals.

## 2011-03-31 NOTE — Progress Notes (Signed)
  Subjective:    Patient ID: Tristan [name], male    DOB: 01-21-1961, 50 y.o.   MRN: [id number]  HPI Tristan [name] is in for his routine visit. He has not had any new problems.    Review of Systems     Objective:   Physical Exam  Constitutional: He appears well-nourished.  HENT:  Mouth/Throat: Oropharynx is clear and moist. No oropharyngeal exudate.  Cardiovascular: Normal rate, regular rhythm and normal heart sounds.   No murmur heard. Pulmonary/Chest: Breath sounds normal. He has no wheezes.  Neurological:       No change in his paraplegia.          Assessment & Plan:

## 2011-04-01 LAB — FUNGAL STAIN: Fungal Smear: NONE SEEN

## 2011-04-03 LAB — T-HELPER CELL (CD4) - (RCID CLINIC ONLY)
CD4 % Helper T Cell: 26 % — ABNORMAL LOW (ref 33–55)
CD4 T Cell Abs: 640 uL (ref 400–2700)

## 2011-04-08 LAB — POCT I-STAT, CHEM 8
BUN: 18 mg/dL (ref 6–23)
Potassium: 3.5 mEq/L (ref 3.5–5.1)
Sodium: 144 mEq/L (ref 135–145)
TCO2: 32 mmol/L (ref 0–100)

## 2011-04-08 LAB — CBC
Platelets: 181 10*3/uL (ref 150–400)
RDW: 13.7 % (ref 11.5–15.5)
WBC: 8.3 10*3/uL (ref 4.0–10.5)

## 2011-04-08 LAB — DIFFERENTIAL
Basophils Absolute: 0.1 10*3/uL (ref 0.0–0.1)
Eosinophils Relative: 0 % (ref 0–5)
Lymphocytes Relative: 29 % (ref 12–46)
Neutro Abs: 5.1 10*3/uL (ref 1.7–7.7)
Neutrophils Relative %: 61 % (ref 43–77)

## 2011-04-08 LAB — URINALYSIS, ROUTINE W REFLEX MICROSCOPIC
Glucose, UA: NEGATIVE mg/dL
Protein, ur: 30 mg/dL — AB
Specific Gravity, Urine: 1.034 — ABNORMAL HIGH (ref 1.005–1.030)
Urobilinogen, UA: 1 mg/dL (ref 0.0–1.0)

## 2011-04-08 LAB — URINE MICROSCOPIC-ADD ON

## 2011-05-06 NOTE — Op Note (Signed)
NAME:  Tristan [name], Tristan [name]                ACCOUNT NO.:  [id number]   MEDICAL RECORD NO.:  [id number]          PATIENT TYPE:  AMB   LOCATION:  ENDO                         [facility]ACILITY[facility]  [facility]   PHYSICIAN:  Anselmo Rod, M.D.  DATE OF BIRTH:  04/10/1961   DATE OF PROCEDURE:  04/23/2009  DATE OF DISCHARGE:  04/23/2009                               OPERATIVE REPORT   PROCEDURE PERFORMED:  Esophagogastroduodenoscopy with a repeat injection  of Botox at the gastroesophageal junction.   PREPROCEDURE PREPARATION:  Informed consent was procured from the  patient.  The patient was fasted for 8 hours prior to the procedure and  kept on a liquid diet for 3 days prior to procedure.  The risks and  benefits of the procedure including aspiration, perforation, bleeding,  etc. were discussed with the patient in great detail.   PREPROCEDURE PHYSICAL:  The patient had stable vital signs.  NECK:  Supple.  CHEST:  Clear to auscultation.  S1 and S2 regular.  ABDOMEN:  Soft with normal bowel sounds.   The patient had an EGD with Botox done about 3 weeks ago with good  results initially and recurrence of his symptoms in the last week and  therefore repeat the EGD is being done with Botox injection.   DESCRIPTION OF PROCEDURE:  The patient was placed in the left lateral  decubitus position and sedated with 100 mcg of Fentanyl and 10 mg of  Versed given intravenously in slow incremental doses. Once the patient  was adequately sedated and maintained on low-flow oxygen and continuous  cardiac monitoring the Pentax video panendoscope was advanced through  the mouthpiece over the tongue into the esophagus under direct vision.  The patient had a very dilated atonic esophagus with a large amount of  debris.  Most of this was suctioned out after multiple washes.  The GE  junction was visualized and 25 units of Botox were injected in every  quadrant above the Z-line.  Whitish exudates were also noted in the  esophagus  and brushings were done to rule out candidiasis.  The gastric  mucosa appeared healthy.  There were no ulcers, erosions, masses or  polyps seen.  The proximal small bowel was not visualized.   IMPRESSION:  1. 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  Multiple      washes done with 25 units of Botox injected in each quadrant above      the Z-line.  2. Multiple whitish exudates in distal esophagus.  Brushings done to      rule out candidiasis considering the fact that the patient has a      history of human immunodeficiency virus.   RECOMMENDATIONS:  1. Await pathology results.  2. Start off with a low residue diet and advance as tolerated.  3. Outpatient follow-up as need arises in the future.      Anselmo Rod, M.D.  Electronically Signed     JNM/MEDQ  D:  04/30/2009  T:  04/30/2009  Job:  [id number]   cc:   Cliffton Asters, M.D.  Fax: [phone]

## 2011-05-06 NOTE — Op Note (Signed)
NAME:  Tristan [name], Tristan [name]                ACCOUNT NO.:  [id number]   MEDICAL RECORD NO.:  [id number]          PATIENT TYPE:  INP   LOCATION:  0007                         [facility]ACILITY[facility]   PHYSICIAN:  Adolph Pollack, M.D.DATE OF BIRTH:  1961-11-04   DATE OF PROCEDURE:  08/21/2009  DATE OF DISCHARGE:                               OPERATIVE REPORT   PREOPERATIVE DIAGNOSIS:  Achalasia.   POSTOPERATIVE DIAGNOSIS:  Achalasia.   PROCEDURE:  1. Laparoscopic Heller myotomy.  2. Upper endoscopy.  3. Dor fundoplication.  4. Repair of gastrotomy   SURGEON:  Adolph Pollack, M.D.   ASSISTANT:  Dr. Ovidio Kin   ANESTHESIA:  General.   INDICATIONS:  Tristan [name] is a 50 year old male with a long history of a  chalasia up.  He has been having intermittent Botox injections but  continues to have symptoms.  After a long discussion with him, he has  elected to undergo laparoscopic Heller myotomy.  We discussed the  procedure, risks, and aftercare preoperatively.   TECHNIQUE:  He is brought to the operating room, placed supine on the  operating table, and a general anesthetic was administered.  Abdominal  wall was sterilely prepped and draped.  Local anesthetic was infiltrated  into the supraumbilical region.  A small subumbilical incision was made  through skin, subcutaneous tissue, fascia and peritoneum, entering the  peritoneal cavity under direct vision.  A pursestring suture of 0 Vicryl  was placed around the fascial edges.  A Hassan trocar was introduced to  the peritoneal cavity.  Pneumoperitoneum was created by insufflation of  CO2 gas.   Next, a laparoscope was introduced.  He had a previous gastrostomy, and  the stomach was somewhat anchored to the anterior abdominal wall.  I  made put a 10 mm trocar in the right mid abdomen medially and a 5 mm  trocar in laterally, and I found that I was able to work around the  stomach without it being in the way of visualization.  I then  placed two  5 mm trocars in the left upper quadrant.  A 5 mm trocar was placed in  the subxiphoid region.  A 5 mm trocar in the subxiphoid view was  removed, and the self-retaining liver retractor was used to retract the  left lobe of the liver anteriorly.   I then approached the hiatus.  Some of the thin gastrohepatic ligament  close to the right crus was then divided with a harmonic scalpel until  the right crus was identified.  Anterior pharyngoesophageal tissue was  divided with a harmonic scalpel to the left crus was divided.  Appeared  to be some scarring irregularity around the gastroesophageal junction  and distal esophagus.  I began dissecting tissue off the esophagus in a  longitudinal fashion, which looked like potential scar or muscle fiber.  I did this for a length of about 6 or 7 cm to the level of the esophagus  down to level of the gastroesophageal junction.  Following this, Dr.  Ezzard Standing performed upper endoscopy which will be dictated in a separate  note.  The upper endoscopy demonstrated that the GE junction was quite  tight.  I was able was able to use the endoscope light and see the  transverse esophageal muscular fibers.  The endoscope was then backed up  into the mid esophagus.   Using a very select low power cautery and blunt dissection, I began  dividing the transverse fibers and retracting them laterally, beginning  up above the GE junction and extending approximately 5-6 cm superiorly.  The circular fibers were then divided bluntly using intermittent  cautery.  Using blunt dissection, the circular and transverse fibers  were pulled back so that there was an 108-degree myotomy.  By then, I  began dividing the gastric fibers in the gastroesophageal junction,  carrying this down approximately 2 cm onto the stomach, part with slight  retraction in the mucosa, there is a small tear in the proximal gastric  mucosa.  This was subsequently repaired with two 4-0 Prolene  sutures.   Repeat endoscopy was performed.  Air was insufflated into the distal  esophagus and proximal stomach, which was placed under water and no leak  from the repair of the gastric mucosa was noted.  Fluid was evacuated.  Air was removed from the stomach, and the upper endoscope was removed.  I measured the myotomy.  It was 180 degrees, going 2 cm down to the  stomach and 6 cm into the distal esophagus.  The second upper endoscopy  showed the scope passing very easily into the stomach with no  resistance.  Also of note, the esophagus was full of liquid food  particles, which were evacuated and had multiple areas of what appeared  to be esophagitis or food esophagitis.   Following this. I then mobilized the short gastric vessels on the  proximal half of the fundus and divided them.  This allowed for fundic  mobilization.  I then performed a Dor fundoplication.  Initially, the  left-sided esophageal musculature, which had been divided, was sewn to  the fundus with interrupted 2-0 silk sutures.  The last one of which,  included the left crus of the diaphragm.  I then brought the stomach  anteriorly over the myotomy site and over the repair site and then sewed  the stomach to the divided musculature at the 9 o'clock position of the  esophagus and anchored it to the diaphragm as well.  The anterior  portion of the stomach was anchored to the anterior portion the  diaphragm with 2 interrupted 2-0 silk sutures.  This provided for an  adequate anterior fundoplication.   Following this, I inspected the area.  There was no bleeding noted.  It  should be noted that right before I completed the anterior  fundoplication, I placed Tisseel over the gastrotomy repair site.   I then removed the liver retractor.  No bleeding was noted from the  liver.  The remaining trocars were removed, and the CO2 gas released.  All trocars were removed.  The supraumbilical fascial defect was closed  by  tightening up and tying down the pursestring suture.  The skin  incisions were closed with 4-0 Monocryl subcuticular stitches.  Steri-  Strips dressing were applied.   He tolerated the procedure well.  The gastrotomy complication was  repaired and recognized peri-intraoperatively.  He was then taken to the  recovery room in satisfactory condition.      Adolph Pollack, M.D.  Electronically Signed     TJR/MEDQ  D:  08/21/2009  T:  08/21/2009  Job:  [id number]   cc:   Cliffton Asters, M.D.  Fax: [phone]   WGNFAO ZHY QMVH, M.D.  Fax: [phone]   [facility]
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
## 2011-05-06 NOTE — Op Note (Signed)
NAME:  Rehmann, Jermaine                ACCOUNT NO.:  [id number]   MEDICAL RECORD NO.:  [id number]          PATIENT TYPE:  INP   LOCATION:  1507                         FACILITY:  [facility]   PHYSICIAN:  Sandria Bales. Ezzard Standing, M.D.  DATE OF BIRTH:  1961-06-02   DATE OF PROCEDURE:  08/21/2009  DATE OF DISCHARGE:                               OPERATIVE REPORT   Date of surgery ?   PREOPERATIVE DIAGNOSIS:  Achalasia/evaluation of Heller myotomy.   POSTOPERATIVE DIAGNOSIS:  Achalasia/evaluation of Heller myotomy.   PROCEDURE:  Esophagogastroscopy.   SURGEON:  Sandria Bales. Ezzard Standing, MD.   ANESTHESIA:  General anesthesia.   INDICATION FOR PROCEDURE:  Mr. Hufford is a 50 year old black gentleman,  who has achalasia and undergoing a laparoscopic Heller myotomy by Dr.  Avel Peace.  I am doing the endoscopy to evaluate the distal  esophagus, the relaxation of the myotomy, the gastroesophageal junction,  and the proximal stomach.   OPERATIVE NOTE:  I did an upper endoscopy twice during the heller  myotomy.   The first time, the patient was in a supine position and under general  anesthesia. I passed the flexible Olympus endoscope without difficulty  into the esophagus.  He had a lot of retained food material in the  esophagus.  I was able to suck/irrigate about half of this out.  I got  down to the distal esophagus and the gastroesophageal junction, but  there was very little relaxation of the distal esophagus.  It appeared  that he had thickened scar tissue around the distal esophagus and would  need more dissection for the myotomy.   After the first endoscopy, I went back and helped Dr. Abbey Chatters with  further dissection in which he identified the longitudinal and  circumferential fibers, muscle fibers of the distal esophagus and  proximal stomach.  In dissecting, he had a small hole in the distal  esophagus, which was repaired, so I went back and did a second  endoscopy.   At the second  endoscopy, the distal esophagus was much more relaxed and  probably opened up to a good 1.5 to 2 cm.  I was able to pass the  endoscope without difficulty into the stomach.  I also insufflated air  while Dr. Abbey Chatters put the saline over the distal esophagus and  stomach junction.  We saw no air leak at his repair of site and it  looked like he had an adequate myotomy.  There was some bile reflux up  into the stomach, but his stomach  was otherwise unremarkable.  The distal esophagus did have a lot of food  material and some coating of the esophagus.  We felt the appearance of  the distal esophagus was probably secondary to food stasisin the  esophagus.   The patient tolerated the procedure well.  Dr. Abbey Chatters will dictate  the primary operation and Heller myotomy.      Sandria Bales. Ezzard Standing, M.D.  Electronically Signed     DHN/MEDQ  D:  08/21/2009  T:  08/21/2009  Job:  [id number]
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
## 2011-05-09 NOTE — Op Note (Signed)
NAME:  Tristan [name], Tristan [name]                          ACCOUNT NO.:  [id number]   MEDICAL RECORD NO.:  [id number]                   PATIENT TYPE:  AMB   LOCATION:  ENDO                                 FACILITY:  [facility]   PHYSICIAN:  Anselmo Rod, M.D.               DATE OF BIRTH:  06-May-1961   DATE OF PROCEDURE:  01/03/2004  DATE OF DISCHARGE:                                 OPERATIVE REPORT   PROCEDURE PERFORMED:  Esophagogastroduodenoscopy with injection of Botox  into the lower esophageal sphincter.   ENDOSCOPIST:  Charna Elizabeth, M.D.   INSTRUMENT USED:  Olympus video panendoscope.   INDICATIONS FOR PROCEDURE:  The patient is a 50 year old African-American  male with a history of achalasia and hypertensive lower esophageal sphincter  at 101 mmHg, normal being 10 to 45 mmHg and a large dilated esophagus.  He  had a barium swallow consistent with achalasia, undergoing  esophagogastroduodenoscopy for Botox injection.   PREPROCEDURE PREPARATION:  Informed consent was procured from the patient.  The patient was fasted for eight hours prior to the procedure.   PREPROCEDURE PHYSICAL:  The patient had stable vital signs.  Neck supple,  chest clear to auscultation.  Abdomen soft with normal bowel sounds.   DESCRIPTION OF PROCEDURE:  The patient was placed in the left lateral  decubitus position and sedated with 80 mg of Demerol and 8 mg of Versed  intravenously.  Once the patient was adequately sedated and maintained on  low-flow oxygen and continuous cardiac monitoring, the Olympus video  panendoscope was advanced through the mouth piece over the tongue into the  esophagus under direct vision.  The patient had a large dilated esophagus  with __________ in the distal esophagus.  Multiple washes were done.  The  patient had hypertensive lower esophageal sphincter.  The gastric mucosa  appeared healthy except for mild antral erosions, retroflexion in the high  cardia revealed no abnormalities.   Proximal small bowel appeared normal.  25  units of Botox were injected in four different quadrants in the lower  esophageal sphincter without problems.  The patient tolerated the procedure  well without complications.   IMPRESSION:  1. Large dilated esophagus consistent with achalasia and Botox injected into     the distal esophagus.  2. Antral erosions.  3. Normal proximal small bowel.   RECOMMENDATIONS:  1. Await response to Botox injections.  2. Outpatient follow-up in the next two weeks for further recommendations.                                               Anselmo Rod, M.D.    JNM/MEDQ  D:  01/03/2004  T:  01/03/2004  Job:  [id number]   cc:   Cliffton Asters, M.D.  [address]  [place]  [place] [address]  Fax: [phone]
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
## 2011-05-09 NOTE — Op Note (Signed)
NAME:  Tristan [name], Tristan [name]                          ACCOUNT NO.:  [id number]   MEDICAL RECORD NO.:  [id number]                   PATIENT TYPE:  AMB   LOCATION:  ENDO                                 FACILITY:  [facility]   PHYSICIAN:  Anselmo Rod, M.D.               DATE OF BIRTH:  [birth year]   DATE OF PROCEDURE:  11/28/2003  DATE OF DISCHARGE:                                 OPERATIVE REPORT   PROCEDURE PERFORMED:  Esophagogastroduodenoscopy with antral biopsies.   ENDOSCOPIST:  Anselmo Rod, M.D.   INSTRUMENT USED:  Olympus video panendoscope.   INDICATIONS FOR PROCEDURE:  Dysphagia for the last four years with excessive  belching in a 50 year old HIV-positive African-American male.  Barium  swallow showed achalasia.  Manometry showed hypertensive LES pressures with  aperistalsis.  EGD is being done to rule out pseudoachalasia.   PREPROCEDURE PREPARATION:  Informed consent was secured from the patient.  The patient fasted for eight hours prior to the procedure.  He had a  manometry prior to his EGD.   PREPROCEDURE PHYSICAL:  VITAL SIGNS:  Stable.  NECK:  Supple.  CHEST:  Clear to auscultation.  ABDOMEN:  Soft, with normal bowel sounds.   DESCRIPTION OF THE PROCEDURE:  The patient was placed in the left lateral  decubitus position and sedated with 50 mg of Demerol and 5 mg of Versed  intravenously.  Once the patient was adequately sedated and maintained on  low-flow oxygen and continuous cardiac monitor, the Olympus video  panendoscope was advanced through the mouthpiece, over the tongue, into the  esophagus under direct vision.  The patient had a large dilated esophagus  with a tight GE junction.  This was gently negotiated.  The scope was then  advanced to the stomach.  Retroflexion of the high cardia revealed no  abnormalities.  There were multiple antral erosions with severe antral  gastritis.  Biopsies were done to rule out presence of H. pylori by  pathology.  The proximal  small bowel appeared normal including the duodenal  bulb.   IMPRESSION:  1. Large dilated esophagus with hypertensive lower esophageal stricture     consistent with achalasia.  2. Antral erosions with severe antral gastritis.  Biopsy done for CLOtest.   RECOMMENDATIONS:  1. Prevacid SoluTab will be tried to help the patient with antral gastritis.  2. Treat him with antibiotics if CLOtest positive.  3. Surgical evaluation for Heller's myotomy.  Discussed with Dr. Abbey Chatters.  4. Avoid all nonsteroidals and follow antireflux measures.  5. Outpatient follow-up in the next four weeks; earlier if need be.                                               Anselmo Rod, M.D.    JNM/MEDQ  D:  11/28/2003  T:  11/28/2003  Job:  [id number]   cc:   Cliffton Asters, M.D.  [address] [place]  [place] [address]  Fax: [phone]   Adolph Pollack, M.D.  [address], [address]  [place]  [place] [address]  Fax: [phone]

## 2011-05-09 NOTE — Op Note (Signed)
NAME:  Tristan [name], Tristan [name]                          ACCOUNT NO.:  [id number]   MEDICAL RECORD NO.:  [id number]                   PATIENT TYPE:  INP   LOCATION:  2002                                 FACILITY:  [facility]   PHYSICIAN:  Leonides Grills, M.D.                  DATE OF BIRTH:  January 16, 1961   DATE OF PROCEDURE:  01/12/2003  DATE OF DISCHARGE:                                 OPERATIVE REPORT   PREOPERATIVE DIAGNOSIS:  Left closed intertrochanteric fracture.   POSTOPERATIVE DIAGNOSIS:  Left closed intertrochanteric fracture.   PROCEDURE:  Open reduction and internal fixation, left intertrochanteric  fracture.   ANESTHESIA:  General endotracheal tube.   SURGEON:  Leonides Grills, M.D.   ASSISTANT:  Lianne Cure, P.A.   ESTIMATED BLOOD LOSS:  Minimal.   DRAINS:  None.   COMPLICATIONS:  None.   DISPOSITION:  Stable to the PAR.   INDICATIONS:  This is a 50 year old left hemiplegic male who fell today and  had immediate left hip pain.  He was then taken to [facility] ER, where x-rays  were obtained and I was consulted for further evaluation and treatment.  He  was consented for the above procedure.  All risks, which include infection,  nerve or vessel injury, malunion, nonunion, hardware rotation, hardware  failure, screw cut-out, DVT, possible PE, were all explained and questions  were encouraged and answered.   DESCRIPTION OF PROCEDURE:  The patient was brought to the operating room and  placed in the supine position initially on a stretcher after adequate  general endotracheal tube anesthesia was administered as well as Ancef 1 g  IV piggyback.  The patient was then placed on a Jackson table and the right  hip was placed in lithotomy position, left hip was placed in a straight  stirrup, and once all bony prominences were well-padded and the patient was  properly positioned, we then visualized the hip in AP and lateral planes.  There was no closed reduction needed.  It was a  nondisplaced fracture.  Once  the patient was properly positioned and x-rays were obtained in the AP and  lateral planes, we then prepped the left hip in a sterile manner.  We then  made a longitudinal incision to the IT band.  The IT band was cut in line  with the incision.  The vastus lateralis was then dissected in line with the  incision as well.  Once bone was encountered using a 135 degree guide, the  threaded guide pin was then placed and was verified in the AP and lateral  planes to be in the center of the femoral head.  Once this was done, we then  took a depth gauge and chose a 105 mm screw.  We then put the triple reamer  on for 105 and reamed out for the screw.  We then placed the screw and  verified the  placement under some AP and lateral planes under C-arm guidance  to be in excellent position.  We then placed a four-hole long-barrel 135  degree Synthes side plate.  This was tamped into proper position.  Once this  was done, we then placed four 4.5 mm fully-threaded cortical screws.  This  had excellent purchase and the plate was anatomic in its placement.  We then  verified this under C-arm guidance in the AP and lateral planes, and this  was in excellent position.  The wound was copiously irrigated with normal  saline.  We then closed the vastus lateralis fascia with 0 Vicryl, the IT  band was closed with 0 Vicryl, subcu was closed with 2-0 Vicryl, skin was  closed with staples.  A sterile dressing was applied, and the patient was  stable to the PAR.                                               Leonides Grills, M.D.    PB/MEDQ  D:  01/12/2003  T:  01/13/2003  Job:  [id number]

## 2011-09-01 ENCOUNTER — Other Ambulatory Visit: Payer: Self-pay | Admitting: *Deleted

## 2011-09-01 DIAGNOSIS — G40909 Epilepsy, unspecified, not intractable, without status epilepticus: Secondary | ICD-10-CM

## 2011-09-03 ENCOUNTER — Other Ambulatory Visit: Payer: Medicare Other

## 2011-09-22 ENCOUNTER — Other Ambulatory Visit (INDEPENDENT_AMBULATORY_CARE_PROVIDER_SITE_OTHER): Payer: Medicare Other

## 2011-09-22 ENCOUNTER — Other Ambulatory Visit: Payer: Self-pay | Admitting: Internal Medicine

## 2011-09-22 DIAGNOSIS — B20 Human immunodeficiency virus [HIV] disease: Secondary | ICD-10-CM

## 2011-09-23 LAB — T-HELPER CELL (CD4) - (RCID CLINIC ONLY): CD4 % Helper T Cell: 23 % — ABNORMAL LOW (ref 33–55)

## 2011-09-24 LAB — HIV-1 RNA QUANT-NO REFLEX-BLD
HIV 1 RNA Quant: 34700 copies/mL — ABNORMAL HIGH (ref <20)
HIV-1 RNA Quant, Log: 4.54 {Log} — ABNORMAL HIGH (ref <1.30)

## 2011-09-24 LAB — T-HELPER CELL (CD4) - (RCID CLINIC ONLY): CD4 T Cell Abs: 790

## 2011-10-09 ENCOUNTER — Ambulatory Visit (INDEPENDENT_AMBULATORY_CARE_PROVIDER_SITE_OTHER): Payer: Medicare Other | Admitting: Internal Medicine

## 2011-10-09 ENCOUNTER — Encounter: Payer: Self-pay | Admitting: Internal Medicine

## 2011-10-09 VITALS — BP 133/84 | HR 79 | Temp 98.0°F | Ht 75.0 in | Wt 167.0 lb

## 2011-10-09 DIAGNOSIS — B20 Human immunodeficiency virus [HIV] disease: Secondary | ICD-10-CM

## 2011-10-09 DIAGNOSIS — R569 Unspecified convulsions: Secondary | ICD-10-CM

## 2011-10-09 DIAGNOSIS — Z23 Encounter for immunization: Secondary | ICD-10-CM

## 2011-10-09 MED ORDER — EFAVIRENZ-EMTRICITAB-TENOFOVIR 600-200-300 MG PO TABS: 1.0000 | ORAL_TABLET | Freq: Every day | ORAL | Status: DC

## 2011-10-09 NOTE — Assessment & Plan Note (Signed)
Tristan [name] has been living with HIV infection at least since 1996. He was on antiretroviral therapy from 1997-2002. Initially he received a CT and 3TC as duel therapy. He was fully suppressed during that time. For reasons neither he or I can recall he stopped that regimen after a few months and had the expected viral rebound. Throughout this. His CD4 [name] were well up in the normal range. He restarting therapy in 1998 with DDI d4T hydroxyurea. His viral load suppressed again to undetectable levels. In 1999 the hydroxyurea was changed to Viramune and he continued at that 3 drug regimen until it was decided that he would take a drug holiday in early 2002. His viral load is now up to 34,700 in his CD4 count has come down to 370. Those changes combined with new evidence that there may be get benefit to treating anyone infected with HIV leads me to recommend restarting therapy. He is agreeable with that so I will start Atripla today.

## 2011-10-09 NOTE — Progress Notes (Signed)
  Subjective:    Patient ID: Tr[name], male    DOB: May 12, 1961, 50 y.o.   MRN: [id number]  HPI [name] is in for his routine visit. He has not missed any of his medications since his last visit and has not had any change in doses. He has not had any seizures recently. He states that he did suffer from a brief bout of depression early this year which is very much unlike him. It seems to have been triggered by the threat of losing his certification for his housing. Fortunately his housing was reapproved and he is felt much better since that time. He states it is very quiet where he lives and has many friends and he hopes he can stay there. Currently he is not having any problems or new concerns.    Review of Systems     Objective:   Physical Exam  Constitutional: He appears well-developed and well-nourished. No distress.  HENT:  Mouth/Throat: Oropharynx is clear and moist. No oropharyngeal exudate.  Cardiovascular: Normal rate, regular rhythm and normal heart sounds.   No murmur heard. Pulmonary/Chest: Breath sounds normal. He has no wheezes. He has no rales.  Abdominal: Soft. Bowel sounds are normal.  Neurological:       No change in his chronic left-sided plegia.  Skin: No rash noted.  Psychiatric: He has a normal mood and affect.          Assessment & Plan:

## 2011-11-25 ENCOUNTER — Other Ambulatory Visit: Payer: Self-pay | Admitting: Infectious Diseases

## 2011-11-25 ENCOUNTER — Other Ambulatory Visit: Payer: Medicare Other

## 2011-11-25 DIAGNOSIS — B20 Human immunodeficiency virus [HIV] disease: Secondary | ICD-10-CM

## 2011-11-25 DIAGNOSIS — R569 Unspecified convulsions: Secondary | ICD-10-CM

## 2011-11-26 LAB — COMPREHENSIVE METABOLIC PANEL
Albumin: 3.8 g/dL (ref 3.5–5.2)
BUN: 13 mg/dL (ref 6–23)
CO2: 30 mEq/L (ref 19–32)
Glucose, Bld: 73 mg/dL (ref 70–99)
Potassium: 3.9 mEq/L (ref 3.5–5.3)
Sodium: 143 mEq/L (ref 135–145)
Total Bilirubin: 0.2 mg/dL — ABNORMAL LOW (ref 0.3–1.2)
Total Protein: 7.5 g/dL (ref 6.0–8.3)

## 2011-11-26 LAB — CBC
HCT: 36.8 % — ABNORMAL LOW (ref 39.0–52.0)
Hemoglobin: 11.8 g/dL — ABNORMAL LOW (ref 13.0–17.0)
MCH: 27.4 pg (ref 26.0–34.0)
RBC: 4.31 MIL/uL (ref 4.22–5.81)

## 2011-11-27 LAB — HIV-1 RNA QUANT-NO REFLEX-BLD
HIV 1 RNA Quant: 96 copies/mL — ABNORMAL HIGH (ref <20)
HIV-1 RNA Quant, Log: 1.98 {Log} — ABNORMAL HIGH (ref <1.30)

## 2011-12-09 ENCOUNTER — Encounter: Payer: Self-pay | Admitting: Internal Medicine

## 2011-12-09 ENCOUNTER — Ambulatory Visit (INDEPENDENT_AMBULATORY_CARE_PROVIDER_SITE_OTHER): Payer: Medicare Other | Admitting: Internal Medicine

## 2011-12-09 VITALS — BP 121/78 | HR 81 | Temp 98.3°F | Ht 75.0 in | Wt 166.8 lb

## 2011-12-09 DIAGNOSIS — R569 Unspecified convulsions: Secondary | ICD-10-CM

## 2011-12-09 DIAGNOSIS — B20 Human immunodeficiency virus [HIV] disease: Secondary | ICD-10-CM

## 2011-12-09 DIAGNOSIS — Z79899 Other long term (current) drug therapy: Secondary | ICD-10-CM

## 2011-12-09 DIAGNOSIS — Z113 Encounter for screening for infections with a predominantly sexual mode of transmission: Secondary | ICD-10-CM

## 2011-12-09 NOTE — Progress Notes (Signed)
Patient ID: Tristan [name], male   DOB: 1961/01/29, 50 y.o.   MRN: [id number]  INFECTIOUS DISEASE PROGRESS NOTE    Subjective: Tristan [name] is in for his routine visit. Tristan [name] started his Atripla 3 months ago and has had no problems tolerating it. Tristan [name] does not recall missing a single dose. Tristan [name] has not had any further problems with depression. Tristan [name] has not had any seizures.  Objective:   General: Tristan [name] appears well and in good spirits Skin: No rash Lungs: Clear Cor: Regular S1 and S2 and no murmurs    Lab Results Lab Results  Component Value Date   WBC 3.2* 11/25/2011   HGB 11.8* 11/25/2011   HCT 36.8* 11/25/2011   MCV 85.4 11/25/2011   PLT 256 11/25/2011    Lab Results  Component Value Date   CREATININE 0.95 11/25/2011   BUN 13 11/25/2011   NA 143 11/25/2011   K 3.9 11/25/2011   CL 99 11/25/2011   CO2 30 11/25/2011    Lab Results  Component Value Date   ALT 18 11/25/2011   AST 28 11/25/2011   ALKPHOS 70 11/25/2011   BILITOT 0.2* 11/25/2011      HIV 1 RNA Quant (copies/mL)  Date Value  11/25/2011 96*  09/22/2011 34700*  03/17/2011 27500*     CD4 T Cell Abs (cmm)  Date Value  11/25/2011 650   09/22/2011 370*  03/17/2011 540        Microbiology: No results found for this or any previous visit (from the past 240 hour(s)).  Studies/Results: No results found.   Assessment: Tristan [name]'s HIV infection is coming under much better control since restarting antiretroviral therapy. I will continue Atripla.  His blood pressure remains under very good control so I will continue hydrochlorothiazide.  I will check a Dilantin level before his next visit  Plan: 1. Continue current medications 2. Followup after blood work in 6 months   Cliffton Asters, MD [facility] Infectious Diseases [facility] [phone] pager   [phone] cell 12/09/2011, 10:40 AM

## 2012-02-03 ENCOUNTER — Telehealth: Payer: Self-pay | Admitting: *Deleted

## 2012-02-03 NOTE — Telephone Encounter (Signed)
C/o severe shoulder pain on R . Constant pain x 3 days. It had been hurting off & on at a lesser pain level. Taking asa. His L arm is non-functioning. I encouraged him to continue OTC pain meds. Try ice or heat to see if that helps. I spoke with Dr. Orvan Falconer who wants to see him tomorrow at 4pm. Pt agreed to come to this appt I discussed the need for a pcp who would have more availability than [facility] and who could care for his other health issues. I urged him to look for one close to his home & make an appt

## 2012-02-04 ENCOUNTER — Telehealth: Payer: Self-pay | Admitting: *Deleted

## 2012-02-04 ENCOUNTER — Ambulatory Visit: Payer: Medicare Other | Admitting: Internal Medicine

## 2012-02-04 NOTE — Telephone Encounter (Signed)
States he borrowed a heating pad & his shoulder feels much better. He will not be coming to the appt. I told him I was glad & reinforced the idea of having a pcp for issues in the future

## 2012-04-10 ENCOUNTER — Other Ambulatory Visit: Payer: Self-pay | Admitting: Internal Medicine

## 2012-04-21 ENCOUNTER — Telehealth: Payer: Self-pay | Admitting: *Deleted

## 2012-04-21 DIAGNOSIS — I10 Essential (primary) hypertension: Secondary | ICD-10-CM

## 2012-04-21 DIAGNOSIS — R569 Unspecified convulsions: Secondary | ICD-10-CM

## 2012-04-21 MED ORDER — HYDROCHLOROTHIAZIDE 50 MG PO TABS: 50.0000 mg | ORAL_TABLET | Freq: Every day | ORAL | Status: DC

## 2012-04-21 MED ORDER — LEVETIRACETAM 500 MG PO TABS: 250.0000 mg | ORAL_TABLET | Freq: Two times a day (BID) | ORAL | Status: DC

## 2012-04-22 ENCOUNTER — Telehealth: Payer: Self-pay | Admitting: *Deleted

## 2012-04-22 DIAGNOSIS — R569 Unspecified convulsions: Secondary | ICD-10-CM

## 2012-04-22 NOTE — Telephone Encounter (Signed)
Please check with box pharmacy to verify that he has been taking 500 mg daily. If so please refill at the same daily dose.

## 2012-05-10 MED ORDER — PHENYTOIN SODIUM EXTENDED 100 MG PO CAPS: 100.0000 mg | ORAL_CAPSULE | ORAL | Status: DC

## 2012-05-10 NOTE — Telephone Encounter (Signed)
Message left for pt that rx was called in to [facility], [address]

## 2012-06-01 ENCOUNTER — Other Ambulatory Visit: Payer: Medicare Other

## 2012-06-02 ENCOUNTER — Other Ambulatory Visit: Payer: Medicare Other

## 2012-06-02 DIAGNOSIS — Z113 Encounter for screening for infections with a predominantly sexual mode of transmission: Secondary | ICD-10-CM

## 2012-06-02 DIAGNOSIS — Z79899 Other long term (current) drug therapy: Secondary | ICD-10-CM | POA: Diagnosis not present

## 2012-06-02 DIAGNOSIS — B20 Human immunodeficiency virus [HIV] disease: Secondary | ICD-10-CM

## 2012-06-02 DIAGNOSIS — R569 Unspecified convulsions: Secondary | ICD-10-CM

## 2012-06-02 LAB — CBC
Hemoglobin: 13 g/dL (ref 13.0–17.0)
MCH: 27.7 pg (ref 26.0–34.0)
MCHC: 33.4 g/dL (ref 30.0–36.0)
Platelets: 282 10*3/uL (ref 150–400)
RDW: 14.6 % (ref 11.5–15.5)

## 2012-06-03 LAB — COMPREHENSIVE METABOLIC PANEL
AST: 27 U/L (ref 0–37)
Alkaline Phosphatase: 98 U/L (ref 39–117)
Glucose, Bld: 68 mg/dL — ABNORMAL LOW (ref 70–99)
Sodium: 138 mEq/L (ref 135–145)
Total Bilirubin: 0.3 mg/dL (ref 0.3–1.2)
Total Protein: 8 g/dL (ref 6.0–8.3)

## 2012-06-03 LAB — T-HELPER CELL (CD4) - (RCID CLINIC ONLY): CD4 T Cell Abs: 800 uL (ref 400–2700)

## 2012-06-03 LAB — RPR

## 2012-06-03 LAB — LIPID PANEL
HDL: 50 mg/dL (ref >39)
LDL Cholesterol: 107 mg/dL — ABNORMAL HIGH (ref 0–99)
Triglycerides: 85 mg/dL (ref <150)
VLDL: 17 mg/dL (ref 0–40)

## 2012-06-04 LAB — HIV-1 RNA QUANT-NO REFLEX-BLD
HIV 1 RNA Quant: <20 copies/mL (ref <20)
HIV-1 RNA Quant, Log: <1.3 {Log} (ref <1.30)

## 2012-06-15 ENCOUNTER — Encounter: Payer: Self-pay | Admitting: Internal Medicine

## 2012-06-15 ENCOUNTER — Ambulatory Visit (INDEPENDENT_AMBULATORY_CARE_PROVIDER_SITE_OTHER): Payer: Medicare Other | Admitting: Internal Medicine

## 2012-06-15 VITALS — BP 137/76 | HR 76 | Temp 98.3°F | Ht 75.0 in | Wt 162.1 lb

## 2012-06-15 DIAGNOSIS — Z79899 Other long term (current) drug therapy: Secondary | ICD-10-CM

## 2012-06-15 DIAGNOSIS — B20 Human immunodeficiency virus [HIV] disease: Secondary | ICD-10-CM | POA: Diagnosis not present

## 2012-06-15 NOTE — Progress Notes (Signed)
Patient ID: Tristan [name], male   DOB: 29-Nov-1961, 51 y.o.   MRN: [id number]     [facility] for Infectious Disease  Patient Active Problem List  Diagnosis  . HIV DISEASE  . HERPES ZOSTER, UNCOMPLICATED  . HYPERLIPIDEMIA  . ABUSE, ALCOHOL, UNSPECIFIED  . DISORDER, TOBACCO USE  . HEMIPARESIS  . DEFECT, VISUAL FIELD NEC  . HYPERTENSION  . ALLERGIC RHINITIS  . ACHALASIA  . GERD  . GYNECOMASTIA  . SEIZURE DISORDER  . VERTIGO  . WEIGHT LOSS, ABNORMAL  . DYSPHAGIA, ORAL PHASE  . SYMPTOM, DISCHARGE, URETHRAL  . HYPERGLYCEMIA  . HIP FRACTURE, LEFT  . INJURY NOS, SITE NOS  . PNEUMONIA, HX OF  . LAPAROSCOPIC MYOTOMY AND FUNDOPLICATION    Patient's Medications  New Prescriptions   No medications on file  Previous Medications   ASPIRIN EC 81 MG TABLET    Take 81 mg by mouth daily.     CALCIUM CARBONATE ANTACID (MAALOX) 600 MG CHEWABLE TABLET    Chew 600 mg by mouth as needed.     EFAVIRENZ-EMTRICTABINE-TENOFOVIR (ATRIPLA) 600-200-300 MG PER TABLET    Take 1 tablet by mouth at bedtime.   HYDROCHLOROTHIAZIDE (HYDRODIURIL) 50 MG TABLET    Take 1 tablet (50 mg total) by mouth daily.   LEVETIRACETAM (KEPPRA) 500 MG TABLET    Take 0.5 tablets (250 mg total) by mouth every 12 (twelve) hours.   PHENYTOIN (DILANTIN) 100 MG ER CAPSULE    Take 1 capsule (100 mg total) by mouth as directed. Take 5 capsules by mouth daily.  Modified Medications   No medications on file  Discontinued Medications   No medications on file    Subjective: Tristan [name] is in for his routine visit. He has not missed any of his medications since his last visit. He has made a concerted effort to eat healthier foods. He rarely eats beef or pork. He feels healthier and his weight but he states that he does want to regain some of the weight he has lost in the past year. He does get regular exercise. He has not had any seizures since his last visit. He has had no further problems with swallowing  difficulties.  Objective: Temp: 98.3 F (36.8 C) (06/25 1427) Temp src: Oral (06/25 1427) BP: 137/76 mmHg (06/25 1427) Pulse Rate: 76  (06/25 1427)  General: His weight is down a little over 9 pounds in the last year. His BMI is just over 20 Skin: No rash Lungs: Clear Cor: Regular S1 and S2 no murmurs Abdomen: Nontender  Lab Results HIV 1 RNA Quant (copies/mL)  Date Value  06/02/2012 <20   11/25/2011 96*  09/22/2011 34700*     CD4 T Cell Abs (cmm)  Date Value  06/02/2012 800   11/25/2011 650   09/22/2011 370*     Assessment: His HIV infection remains under excellent control.  His blood pressure is at goal.   His LDL is down to 107.  Plan: 1. Continue current antiretroviral regimen 2. Monitor weight 3. Followup after lab work in 6 months   Cliffton Asters, MD [facility] for Infectious Disease [facility] [phone] pager   [phone] cell 06/15/2012, 2:41 PM

## 2012-08-02 ENCOUNTER — Other Ambulatory Visit: Payer: Self-pay | Admitting: Infectious Disease

## 2012-09-28 ENCOUNTER — Other Ambulatory Visit: Payer: Self-pay | Admitting: *Deleted

## 2012-09-28 DIAGNOSIS — R569 Unspecified convulsions: Secondary | ICD-10-CM

## 2012-09-28 DIAGNOSIS — I10 Essential (primary) hypertension: Secondary | ICD-10-CM

## 2012-09-28 MED ORDER — PHENYTOIN SODIUM EXTENDED 100 MG PO CAPS: 100.0000 mg | ORAL_CAPSULE | ORAL | Status: DC

## 2012-09-28 MED ORDER — HYDROCHLOROTHIAZIDE 50 MG PO TABS: 50.0000 mg | ORAL_TABLET | Freq: Every day | ORAL | Status: DC

## 2012-10-06 DIAGNOSIS — Z79899 Other long term (current) drug therapy: Secondary | ICD-10-CM | POA: Diagnosis not present

## 2012-10-06 DIAGNOSIS — G40209 Localization-related (focal) (partial) symptomatic epilepsy and epileptic syndromes with complex partial seizures, not intractable, without status epilepticus: Secondary | ICD-10-CM | POA: Diagnosis not present

## 2012-10-07 ENCOUNTER — Other Ambulatory Visit: Payer: Self-pay | Admitting: Internal Medicine

## 2012-12-13 ENCOUNTER — Encounter: Payer: Self-pay | Admitting: *Deleted

## 2012-12-30 ENCOUNTER — Other Ambulatory Visit: Payer: Medicare Other

## 2012-12-30 DIAGNOSIS — Z79899 Other long term (current) drug therapy: Secondary | ICD-10-CM

## 2012-12-30 DIAGNOSIS — B20 Human immunodeficiency virus [HIV] disease: Secondary | ICD-10-CM

## 2012-12-30 LAB — CBC
HCT: 37.6 % — ABNORMAL LOW (ref 39.0–52.0)
MCHC: 33.8 g/dL (ref 30.0–36.0)
MCV: 83.6 fL (ref 78.0–100.0)
RDW: 15.3 % (ref 11.5–15.5)
WBC: 5.8 10*3/uL (ref 4.0–10.5)

## 2012-12-31 LAB — COMPREHENSIVE METABOLIC PANEL
AST: 25 U/L (ref 0–37)
BUN: 13 mg/dL (ref 6–23)
Calcium: 9.3 mg/dL (ref 8.4–10.5)
Chloride: 101 mEq/L (ref 96–112)
Creat: 0.88 mg/dL (ref 0.50–1.35)

## 2012-12-31 LAB — LIPID PANEL
Cholesterol: 174 mg/dL (ref 0–200)
HDL: 41 mg/dL (ref >39)
Total CHOL/HDL Ratio: 4.2 Ratio
Triglycerides: 204 mg/dL — ABNORMAL HIGH (ref <150)

## 2013-01-13 ENCOUNTER — Encounter: Payer: Self-pay | Admitting: Internal Medicine

## 2013-01-13 ENCOUNTER — Ambulatory Visit (INDEPENDENT_AMBULATORY_CARE_PROVIDER_SITE_OTHER): Payer: Medicare Other | Admitting: Internal Medicine

## 2013-01-13 VITALS — BP 136/81 | HR 74 | Temp 98.5°F | Ht 75.0 in | Wt 168.5 lb

## 2013-01-13 DIAGNOSIS — Z0000 Encounter for general adult medical examination without abnormal findings: Secondary | ICD-10-CM

## 2013-01-13 DIAGNOSIS — B20 Human immunodeficiency virus [HIV] disease: Secondary | ICD-10-CM | POA: Diagnosis not present

## 2013-01-13 NOTE — Progress Notes (Signed)
Patient ID: Tristan [name], male   DOB: [birth year], 52 y.o.   MRN: [id number]     [facility] for Infectious Disease  Patient Active Problem List  Diagnosis  . HIV DISEASE  . HERPES ZOSTER, UNCOMPLICATED  . HYPERLIPIDEMIA  . ABUSE, ALCOHOL, UNSPECIFIED  . DISORDER, TOBACCO USE  . HEMIPARESIS  . DEFECT, VISUAL FIELD NEC  . HYPERTENSION  . ALLERGIC RHINITIS  . ACHALASIA  . GERD  . GYNECOMASTIA  . SEIZURE DISORDER  . VERTIGO  . WEIGHT LOSS, ABNORMAL  . DYSPHAGIA, ORAL PHASE  . SYMPTOM, DISCHARGE, URETHRAL  . HYPERGLYCEMIA  . HIP FRACTURE, LEFT  . INJURY NOS, SITE NOS  . PNEUMONIA, HX OF  . LAPAROSCOPIC MYOTOMY AND FUNDOPLICATION    Patient's Medications  New Prescriptions   No medications on file  Previous Medications   ASPIRIN EC 81 MG TABLET    Take 81 mg by mouth daily.     ATRIPLA 600-200-300 MG PER TABLET    TAKE ONE TABLET BY MOUTH EVERY DAY AT BEDTIME   CALCIUM CARBONATE ANTACID (MAALOX) 600 MG CHEWABLE TABLET    Chew 600 mg by mouth as needed.     HYDROCHLOROTHIAZIDE (HYDRODIURIL) 50 MG TABLET    TAKE ONE TABLET BY MOUTH EVERY DAY   LEVETIRACETAM (KEPPRA) 500 MG TABLET    Take 0.5 tablets (250 mg total) by mouth every 12 (twelve) hours.   MULTIPLE VITAMIN (MULTIVITAMIN) TABLET    Take 1 tablet by mouth daily.   PHENYTOIN (DILANTIN) 100 MG ER CAPSULE    Take 1 capsule (100 mg total) by mouth as directed. Take 5 capsules by mouth daily.  Modified Medications   No medications on file  Discontinued Medications   HYDROCHLOROTHIAZIDE (HYDRODIURIL) 50 MG TABLET    Take 1 tablet (50 mg total) by mouth daily.    Subjective: Tristan [name] is in for his routine visit. As usual Tristan [name] never misses a single dose of his Atripla. Tristan [name] states that Tristan [name] is doing well and has had no problems or concerns since his last visit. Tristan [name]'s had no change in his medications. Tristan [name] has had no seizures.  Objective: Temp: 98.5 F (36.9 C) (01/23 1403) Temp src: Oral (01/23 1403) BP: 136/81 mmHg (01/23  1403) Pulse Rate: 74  (01/23 1403)  General: Tristan [name] is in good spirits as usual Skin: No rash Lungs: Clear Cor: Regular S1 and S2 no murmurs  Lab Results HIV 1 RNA Quant (copies/mL)  Date Value  12/30/2012 <20   06/02/2012 <20   11/25/2011 96*     CD4 T Cell Abs (cmm)  Date Value  12/30/2012 1250   06/02/2012 800   11/25/2011 650      Lab Results  Component Value Date   CREATININE 0.88 12/30/2012   BUN 13 12/30/2012   NA 139 12/30/2012   K 3.8 12/30/2012   CL 101 12/30/2012   CO2 30 12/30/2012    Lab Results  Component Value Date   CHOL 174 12/30/2012   HDL 41 12/30/2012   LDLCALC 92 12/30/2012   TRIG 204* 12/30/2012   CHOLHDL 4.2 12/30/2012    Assessment: His HIV infection remains under excellent control. I will continue Atripla and see him back after lab work in 6 months.  His blood pressure is under excellent control. Tristan [name] has mild elevation of his triglycerides intermittently.  Plan: 1. Continue Atripla 2. Help him establish primary care 3. Followup after lab work in 6 months 4. Influenza vaccination today   Tristan [name]  Orvan Falconer, MD [facility] for Infectious Disease [facility] [phone] pager   [phone] cell 01/13/2013, 2:16 PM

## 2013-03-30 ENCOUNTER — Other Ambulatory Visit: Payer: Self-pay | Admitting: Neurology

## 2013-04-04 ENCOUNTER — Ambulatory Visit (INDEPENDENT_AMBULATORY_CARE_PROVIDER_SITE_OTHER): Payer: Medicare Other | Admitting: Nurse Practitioner

## 2013-04-04 ENCOUNTER — Encounter: Payer: Self-pay | Admitting: Nurse Practitioner

## 2013-04-04 VITALS — BP 125/70 | HR 75 | Ht 76.0 in | Wt 167.0 lb

## 2013-04-04 DIAGNOSIS — R569 Unspecified convulsions: Secondary | ICD-10-CM

## 2013-04-04 DIAGNOSIS — R269 Unspecified abnormalities of gait and mobility: Secondary | ICD-10-CM

## 2013-04-04 DIAGNOSIS — G8190 Hemiplegia, unspecified affecting unspecified side: Secondary | ICD-10-CM

## 2013-04-04 HISTORY — DX: Unspecified abnormalities of gait and mobility: R26.9

## 2013-04-04 NOTE — Patient Instructions (Addendum)
Continue Dilantin and Keppra at current doses Dilantin level in the morning before morning dose  Will call with results and make adjustments as necessary Followup 6 months
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
## 2013-04-04 NOTE — Progress Notes (Signed)
I reviewed note and agree with plan.   [name], MD 04/04/2013, 1:01 PM Certified in Neurology, Neurophysiology and Neuroimaging  [facility] [address] [place], [place] [address] [phone]

## 2013-04-05 ENCOUNTER — Encounter: Payer: Medicare Other | Admitting: Family Medicine

## 2013-04-05 ENCOUNTER — Encounter: Payer: Self-pay | Admitting: Family Medicine

## 2013-04-05 NOTE — Progress Notes (Signed)
No show - sent letter to call to reschedule  This encounter was created in error - please disregard.

## 2013-04-27 ENCOUNTER — Ambulatory Visit (INDEPENDENT_AMBULATORY_CARE_PROVIDER_SITE_OTHER): Payer: Medicare Other | Admitting: Family Medicine

## 2013-04-27 ENCOUNTER — Encounter: Payer: Self-pay | Admitting: Family Medicine

## 2013-04-27 VITALS — BP 132/80 | Temp 97.9°F | Ht 75.0 in | Wt 169.3 lb

## 2013-04-27 DIAGNOSIS — R569 Unspecified convulsions: Secondary | ICD-10-CM

## 2013-04-27 DIAGNOSIS — Z7689 Persons encountering health services in other specified circumstances: Secondary | ICD-10-CM

## 2013-04-27 DIAGNOSIS — Z7189 Other specified counseling: Secondary | ICD-10-CM

## 2013-04-27 DIAGNOSIS — Z1211 Encounter for screening for malignant neoplasm of colon: Secondary | ICD-10-CM | POA: Diagnosis not present

## 2013-04-27 DIAGNOSIS — I10 Essential (primary) hypertension: Secondary | ICD-10-CM | POA: Diagnosis not present

## 2013-04-27 DIAGNOSIS — B20 Human immunodeficiency virus [HIV] disease: Secondary | ICD-10-CM | POA: Diagnosis not present

## 2013-04-27 DIAGNOSIS — E785 Hyperlipidemia, unspecified: Secondary | ICD-10-CM

## 2013-04-27 MED ORDER — HYDROCHLOROTHIAZIDE 50 MG PO TABS: 50.0000 mg | ORAL_TABLET | Freq: Every day | ORAL | Status: DC

## 2013-04-27 NOTE — Patient Instructions (Addendum)
-  We have ordered labs or studies at this visit. It can take up to 1-2 weeks for results and processing. We will contact you with instructions IF your results are abnormal. Normal results will be released to your [facility]. If you have not heard from [place] or can not find your results in [facility] in 2 weeks please contact our office.  -PLEASE SIGN UP FOR MYCHART TODAY   We recommend the following healthy lifestyle measures: - eat a healthy diet consisting of lots of vegetables, fruits, beans, nuts, seeds, healthy meats such as white chicken and fish and whole grains.  - avoid fried foods, fast food, processed foods, sodas, red meet and other fattening foods.  - get a least 150 minutes of aerobic exercise per week.   -discuss vaccines and update records with Dr. Orvan Falconer  -We placed a referral for you as discussed for your colonoscopy. It usually takes about 1-2 weeks to process and schedule this referral. If you have not heard from [place] regarding this appointment in 2 weeks please contact our office.  -please schedule to see a dentist  Follow up in: 4-6 months
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
## 2013-05-03 ENCOUNTER — Encounter: Payer: Self-pay | Admitting: Internal Medicine

## 2013-06-21 ENCOUNTER — Ambulatory Visit (AMBULATORY_SURGERY_CENTER): Payer: Medicare Other

## 2013-06-21 VITALS — Ht 75.0 in | Wt 159.6 lb

## 2013-06-21 DIAGNOSIS — Z800 Family history of malignant neoplasm of digestive organs: Secondary | ICD-10-CM

## 2013-06-21 MED ORDER — MOVIPREP 100 G PO SOLR: ORAL | Status: DC

## 2013-06-21 NOTE — Progress Notes (Signed)
Pt came into the office today for his colonoscopy with Dr Rhea Belton on 07/05/13.He has GI history with Dr Loreta Ave 3 years ago ( no colonoscopy), and a family hx of colon cancer. Pt has signed a medical release form which will be given to [facility] ([facility])

## 2013-07-05 ENCOUNTER — Ambulatory Visit (AMBULATORY_SURGERY_CENTER): Payer: Medicare Other | Admitting: Internal Medicine

## 2013-07-05 ENCOUNTER — Encounter: Payer: Self-pay | Admitting: Internal Medicine

## 2013-07-05 VITALS — BP 161/84 | HR 69 | Temp 97.7°F | Resp 16 | Ht 75.0 in | Wt 159.0 lb

## 2013-07-05 DIAGNOSIS — Z1211 Encounter for screening for malignant neoplasm of colon: Secondary | ICD-10-CM

## 2013-07-05 DIAGNOSIS — I10 Essential (primary) hypertension: Secondary | ICD-10-CM | POA: Diagnosis not present

## 2013-07-05 DIAGNOSIS — Z800 Family history of malignant neoplasm of digestive organs: Secondary | ICD-10-CM

## 2013-07-05 DIAGNOSIS — G8190 Hemiplegia, unspecified affecting unspecified side: Secondary | ICD-10-CM | POA: Diagnosis not present

## 2013-07-05 MED ORDER — SODIUM CHLORIDE 0.9 % IV SOLN: 500.0000 mL | INTRAVENOUS | Status: DC

## 2013-07-05 NOTE — Op Note (Signed)
[facility] [address] [place], [address]   COLONOSCOPY PROCEDURE REPORT  PATIENT: Tristan [name], Tristan [name]  MR#: [id number] BIRTHDATE: 1961/06/08 , 51  yrs. old GENDER: Male ENDOSCOPIST: Beverley Fiedler, MD REFERRED BY: Kriste Basque, DO PROCEDURE DATE:  07/05/2013 PROCEDURE:   Colonoscopy, screening ASA CLASS:   Class III INDICATIONS:elevated risk screening, Patient's immediate family history of colon cancer, and first colonoscopy. MEDICATIONS: MAC sedation, administered by CRNA and propofol (Diprivan) 250mg  IV  DESCRIPTION OF PROCEDURE:   After the risks benefits and alternatives of the procedure were thoroughly explained, informed consent was obtained.  A digital rectal exam revealed no rectal mass.   The [id number]  endoscope was introduced through the anus and advanced to the cecum, which was identified by both the appendix and ileocecal valve. No adverse events experienced. The quality of the prep was Moviprep fair  The instrument was then slowly withdrawn as the colon was fully examined.      COLON FINDINGS: Fair preparation (right colon greater than left). Fair preparation decreases the sensitivity of colonoscopy for the detection of colon polyps.  A normal appearing cecum, ileocecal valve, and appendiceal orifice were identified.  The ascending, hepatic flexure, transverse, splenic flexure, descending, sigmoid colon and rectum appeared unremarkable.  No polyps or cancers were seen.   Retroflexed views revealed external hemorrhoids. The time to cecum=1 minutes 40 seconds.  Withdrawal time=9 minutes 57 seconds.  The scope was withdrawn and the procedure completed.  COMPLICATIONS: There were no complications.  ENDOSCOPIC IMPRESSION: 1.   Fair preparation. 2.   Normal colon 3.   Small external hemorrhoids  RECOMMENDATIONS: Given your significant family history of colon cancer, you should have a repeat colonoscopy in 5 years   eSigned:  Beverley Fiedler, MD 07/05/2013 10:44 AM   cc: The Patient; Kriste Basque, DO

## 2013-07-05 NOTE — Progress Notes (Signed)
Patient did not experience any of the following events: a burn prior to discharge; a fall within the facility; wrong site/side/patient/procedure/implant event; or a hospital transfer or hospital admission upon discharge from the facility. (G8907) Patient did not have preoperative order for IV antibiotic SSI prophylaxis. (G8918)  

## 2013-07-05 NOTE — Progress Notes (Signed)
PATIENT ASSISTED TO DRESS.

## 2013-07-05 NOTE — Patient Instructions (Signed)
YOU HAD AN ENDOSCOPIC PROCEDURE TODAY AT THE [facility]: Refer to the procedure report that was given to you for any specific questions about what was found during the examination.  If the procedure report does not answer your questions, please call your gastroenterologist to clarify.  If you requested that your care partner not be given the details of your procedure findings, then the procedure report has been included in a sealed envelope for you to review at your convenience later.  YOU SHOULD EXPECT: Some feelings of bloating in the abdomen. Passage of more gas than usual.  Walking can help get rid of the air that was put into your GI tract during the procedure and reduce the bloating. If you had a lower endoscopy (such as a colonoscopy or flexible sigmoidoscopy) you may notice spotting of blood in your stool or on the toilet paper. If you underwent a bowel prep for your procedure, then you may not have a normal bowel movement for a few days.  DIET: Your first meal following the procedure should be a light meal and then it is ok to progress to your normal diet.  A half-sandwich or bowl of soup is an example of a good first meal.  Heavy or fried foods are harder to digest and may make you feel nauseous or bloated.  Likewise meals heavy in dairy and vegetables can cause extra gas to form and this can also increase the bloating.  Drink plenty of fluids but you should avoid alcoholic beverages for 24 hours.  ACTIVITY: Your care partner should take you home directly after the procedure.  You should plan to take it easy, moving slowly for the rest of the day.  You can resume normal activity the day after the procedure however you should NOT DRIVE or use heavy machinery for 24 hours (because of the sedation medicines used during the test).    SYMPTOMS TO REPORT IMMEDIATELY: A gastroenterologist can be reached at any hour.  During normal business hours, 8:30 AM to 5:00 PM Monday through Friday,  call [phone].  After hours and on weekends, please call the GI answering service at [phone] who will take a message and have the physician on call contact you.   Following lower endoscopy (colonoscopy or flexible sigmoidoscopy):  Excessive amounts of blood in the stool  Significant tenderness or worsening of abdominal pains  Swelling of the abdomen that is new, acute  Fever of 100F or higher    FOLLOW UP: If any biopsies were taken you will be contacted by phone or by letter within the next 1-3 weeks.  Call your gastroenterologist if you have not heard about the biopsies in 3 weeks.  Our staff will call the home number listed on your records the next business day following your procedure to check on you and address any questions or concerns that you may have at that time regarding the information given to you following your procedure. This is a courtesy call and so if there is no answer at the home number and we have not heard from you through the emergency physician on call, we will assume that you have returned to your regular daily activities without incident.  SIGNATURES/CONFIDENTIALITY: You and/or your care partner have signed paperwork which will be entered into your electronic medical record.  These signatures attest to the fact that that the information above on your After Visit Summary has been reviewed and is understood.  Full responsibility of the confidentiality   of this discharge information lies with you and/or your care-partner.     

## 2013-07-06 ENCOUNTER — Other Ambulatory Visit: Payer: Medicare Other

## 2013-07-06 ENCOUNTER — Telehealth: Payer: Self-pay

## 2013-07-06 NOTE — Telephone Encounter (Signed)
Left message on answering machine. 

## 2013-07-08 ENCOUNTER — Other Ambulatory Visit: Payer: Medicare Other

## 2013-07-08 DIAGNOSIS — B20 Human immunodeficiency virus [HIV] disease: Secondary | ICD-10-CM | POA: Diagnosis not present

## 2013-07-26 ENCOUNTER — Encounter: Payer: Self-pay | Admitting: Internal Medicine

## 2013-07-26 ENCOUNTER — Ambulatory Visit (INDEPENDENT_AMBULATORY_CARE_PROVIDER_SITE_OTHER): Payer: Medicare Other | Admitting: Internal Medicine

## 2013-07-26 VITALS — BP 122/77 | HR 69 | Temp 97.8°F | Ht 75.0 in | Wt 163.8 lb

## 2013-07-26 DIAGNOSIS — B20 Human immunodeficiency virus [HIV] disease: Secondary | ICD-10-CM

## 2013-07-26 DIAGNOSIS — Z79899 Other long term (current) drug therapy: Secondary | ICD-10-CM | POA: Diagnosis not present
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
## 2013-09-05 ENCOUNTER — Other Ambulatory Visit: Payer: Self-pay | Admitting: Internal Medicine

## 2013-09-05 DIAGNOSIS — B20 Human immunodeficiency virus [HIV] disease: Secondary | ICD-10-CM

## 2013-10-04 ENCOUNTER — Other Ambulatory Visit: Payer: Self-pay | Admitting: Neurology

## 2013-10-26 ENCOUNTER — Other Ambulatory Visit: Payer: Self-pay | Admitting: Neurology

## 2013-10-31 ENCOUNTER — Other Ambulatory Visit: Payer: Self-pay | Admitting: Neurology

## 2014-01-25 ENCOUNTER — Other Ambulatory Visit: Payer: Medicare Other

## 2014-01-25 DIAGNOSIS — B20 Human immunodeficiency virus [HIV] disease: Secondary | ICD-10-CM | POA: Diagnosis not present

## 2014-01-25 DIAGNOSIS — Z79899 Other long term (current) drug therapy: Secondary | ICD-10-CM

## 2014-01-25 DIAGNOSIS — Z113 Encounter for screening for infections with a predominantly sexual mode of transmission: Secondary | ICD-10-CM

## 2014-01-25 LAB — CBC
HCT: 36.2 % — ABNORMAL LOW (ref 39.0–52.0)
HEMOGLOBIN: 12 g/dL — AB (ref 13.0–17.0)
MCH: 28.3 pg (ref 26.0–34.0)
MCHC: 33.1 g/dL (ref 30.0–36.0)
MCV: 85.4 fL (ref 78.0–100.0)
Platelets: 227 10*3/uL (ref 150–400)
RBC: 4.24 MIL/uL (ref 4.22–5.81)
RDW: 14.9 % (ref 11.5–15.5)
WBC: 7 10*3/uL (ref 4.0–10.5)

## 2014-01-25 LAB — LIPID PANEL
Cholesterol: 128 mg/dL (ref 0–200)
HDL: 45 mg/dL (ref >39)
LDL Cholesterol: 64 mg/dL (ref 0–99)
TRIGLYCERIDES: 95 mg/dL (ref <150)
Total CHOL/HDL Ratio: 2.8 Ratio
VLDL: 19 mg/dL (ref 0–40)

## 2014-01-25 LAB — COMPREHENSIVE METABOLIC PANEL
ALK PHOS: 77 U/L (ref 39–117)
ALT: 29 U/L (ref 0–53)
AST: 27 U/L (ref 0–37)
Albumin: 4.2 g/dL (ref 3.5–5.2)
BUN: 12 mg/dL (ref 6–23)
CALCIUM: 8.6 mg/dL (ref 8.4–10.5)
CO2: 29 mEq/L (ref 19–32)
Chloride: 105 mEq/L (ref 96–112)
Creat: 0.79 mg/dL (ref 0.50–1.35)
GLUCOSE: 79 mg/dL (ref 70–99)
Potassium: 3.4 mEq/L — ABNORMAL LOW (ref 3.5–5.3)
Sodium: 144 mEq/L (ref 135–145)
Total Bilirubin: 0.2 mg/dL (ref 0.2–1.2)
Total Protein: 6.8 g/dL (ref 6.0–8.3)

## 2014-01-26 LAB — RPR

## 2014-01-26 LAB — T-HELPER CELL (CD4) - (RCID CLINIC ONLY)
CD4 % Helper T Cell: 43 % (ref 33–55)
CD4 T CELL ABS: 860 /uL (ref 400–2700)

## 2014-01-27 LAB — HIV-1 RNA QUANT-NO REFLEX-BLD: HIV 1 RNA Quant: <20 copies/mL (ref <20)

## 2014-02-07 ENCOUNTER — Ambulatory Visit: Payer: Medicare Other | Admitting: Internal Medicine

## 2014-03-04 ENCOUNTER — Other Ambulatory Visit: Payer: Self-pay | Admitting: Internal Medicine

## 2014-03-04 DIAGNOSIS — B20 Human immunodeficiency virus [HIV] disease: Secondary | ICD-10-CM

## 2014-03-07 ENCOUNTER — Encounter: Payer: Self-pay | Admitting: Internal Medicine

## 2014-03-07 ENCOUNTER — Ambulatory Visit (INDEPENDENT_AMBULATORY_CARE_PROVIDER_SITE_OTHER): Payer: Medicare Other | Admitting: Internal Medicine

## 2014-03-07 VITALS — BP 123/78 | HR 79 | Temp 98.3°F | Ht 75.0 in | Wt 162.5 lb

## 2014-03-07 DIAGNOSIS — Z23 Encounter for immunization: Secondary | ICD-10-CM

## 2014-03-07 DIAGNOSIS — B20 Human immunodeficiency virus [HIV] disease: Secondary | ICD-10-CM

## 2014-03-07 DIAGNOSIS — R269 Unspecified abnormalities of gait and mobility: Secondary | ICD-10-CM | POA: Diagnosis not present

## 2014-03-07 DIAGNOSIS — T148XXA Other injury of unspecified body region, initial encounter: Secondary | ICD-10-CM | POA: Diagnosis not present

## 2014-03-07 DIAGNOSIS — R569 Unspecified convulsions: Secondary | ICD-10-CM | POA: Diagnosis not present

## 2014-03-07 DIAGNOSIS — W3400XA Accidental discharge from unspecified firearms or gun, initial encounter: Secondary | ICD-10-CM

## 2014-03-07 NOTE — Addendum Note (Signed)
Addended by: Lorne Skeens D on: 03/07/2014 11:46 AM   Modules accepted: Orders
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
## 2014-03-16 ENCOUNTER — Ambulatory Visit: Payer: Medicare Other | Attending: Internal Medicine | Admitting: Rehabilitative and Restorative Service Providers"

## 2014-03-16 DIAGNOSIS — Z9181 History of falling: Secondary | ICD-10-CM | POA: Diagnosis not present

## 2014-03-16 DIAGNOSIS — R269 Unspecified abnormalities of gait and mobility: Secondary | ICD-10-CM | POA: Diagnosis not present

## 2014-03-16 DIAGNOSIS — IMO0001 Reserved for inherently not codable concepts without codable children: Secondary | ICD-10-CM | POA: Insufficient documentation

## 2014-04-04 ENCOUNTER — Encounter: Payer: Self-pay | Admitting: Nurse Practitioner

## 2014-04-04 ENCOUNTER — Ambulatory Visit (INDEPENDENT_AMBULATORY_CARE_PROVIDER_SITE_OTHER): Payer: Medicare Other | Admitting: Nurse Practitioner

## 2014-04-04 VITALS — BP 112/67 | HR 77 | Ht 76.0 in | Wt 167.0 lb

## 2014-04-04 DIAGNOSIS — R569 Unspecified convulsions: Secondary | ICD-10-CM

## 2014-04-04 DIAGNOSIS — Z5181 Encounter for therapeutic drug level monitoring: Secondary | ICD-10-CM

## 2014-04-04 LAB — PHENYTOIN LEVEL, TOTAL: PHENYTOIN LVL: 15 ug/mL (ref 10.0–20.0)

## 2014-04-04 MED ORDER — LEVETIRACETAM 500 MG PO TABS: ORAL_TABLET | ORAL | Status: DC

## 2014-04-04 MED ORDER — PHENYTOIN SODIUM EXTENDED 100 MG PO CAPS: 200.0000 mg | ORAL_CAPSULE | Freq: Two times a day (BID) | ORAL | Status: DC

## 2014-04-04 NOTE — Patient Instructions (Signed)
Continue Dilantin at current dose will refill Continue Keppra at current dose will refill Will get a Dilantin level today Followup yearly when necessary Call for any seizure activity
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
## 2014-04-04 NOTE — Progress Notes (Signed)
I have read the note, and I agree with the clinical assessment and plan.  Charles K Willis   

## 2014-04-05 NOTE — Progress Notes (Signed)
Quick Note:  Shared labs with patient per Ms Martin's findings, he verbalized understanding ______

## 2014-04-14 ENCOUNTER — Encounter: Payer: Self-pay | Admitting: *Deleted

## 2014-04-14 NOTE — Progress Notes (Signed)
Patient ID: Tristan [name], male   DOB: June 29, 1961, 53 y.o.   MRN: [id number] Request for OV notes from [employer].  Provided last office notes and faxed for motorized wheelchair for the pt.

## 2014-08-24 ENCOUNTER — Other Ambulatory Visit: Payer: Medicare Other

## 2014-09-04 ENCOUNTER — Other Ambulatory Visit: Payer: Self-pay | Admitting: Internal Medicine

## 2014-09-07 ENCOUNTER — Ambulatory Visit: Payer: Medicare Other | Admitting: Internal Medicine

## 2014-09-12 ENCOUNTER — Ambulatory Visit: Payer: Medicare Other | Admitting: Internal Medicine

## 2014-09-28 ENCOUNTER — Other Ambulatory Visit: Payer: Medicare Other

## 2014-09-28 DIAGNOSIS — B20 Human immunodeficiency virus [HIV] disease: Secondary | ICD-10-CM | POA: Diagnosis not present

## 2014-09-29 ENCOUNTER — Other Ambulatory Visit: Payer: Self-pay | Admitting: Family Medicine

## 2014-09-29 LAB — COMPREHENSIVE METABOLIC PANEL
ALBUMIN: 4.5 g/dL (ref 3.5–5.2)
ALK PHOS: 93 U/L (ref 39–117)
ALT: 18 U/L (ref 0–53)
AST: 22 U/L (ref 0–37)
BUN: 9 mg/dL (ref 6–23)
CO2: 29 mEq/L (ref 19–32)
Calcium: 9.1 mg/dL (ref 8.4–10.5)
Chloride: 101 mEq/L (ref 96–112)
Creat: 0.74 mg/dL (ref 0.50–1.35)
Glucose, Bld: 58 mg/dL — ABNORMAL LOW (ref 70–99)
POTASSIUM: 4 meq/L (ref 3.5–5.3)
SODIUM: 139 meq/L (ref 135–145)
TOTAL PROTEIN: 7.8 g/dL (ref 6.0–8.3)
Total Bilirubin: 0.3 mg/dL (ref 0.2–1.2)

## 2014-09-29 LAB — HIV-1 RNA QUANT-NO REFLEX-BLD

## 2014-09-29 LAB — T-HELPER CELL (CD4) - (RCID CLINIC ONLY)
CD4 % Helper T Cell: 40 % (ref 33–55)
CD4 T Cell Abs: 1370 /uL (ref 400–2700)

## 2014-10-12 ENCOUNTER — Ambulatory Visit (INDEPENDENT_AMBULATORY_CARE_PROVIDER_SITE_OTHER): Payer: Medicare Other | Admitting: Internal Medicine

## 2014-10-12 VITALS — BP 150/78 | HR 67 | Temp 98.3°F | Wt 167.0 lb

## 2014-10-12 DIAGNOSIS — B20 Human immunodeficiency virus [HIV] disease: Secondary | ICD-10-CM | POA: Diagnosis not present

## 2014-10-12 DIAGNOSIS — Z79899 Other long term (current) drug therapy: Secondary | ICD-10-CM

## 2014-10-12 DIAGNOSIS — Z23 Encounter for immunization: Secondary | ICD-10-CM
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
## 2014-12-01 ENCOUNTER — Other Ambulatory Visit: Payer: Self-pay | Admitting: Internal Medicine

## 2014-12-01 DIAGNOSIS — B20 Human immunodeficiency virus [HIV] disease: Secondary | ICD-10-CM

## 2014-12-04 ENCOUNTER — Other Ambulatory Visit: Payer: Self-pay | Admitting: Family Medicine

## 2015-04-03 ENCOUNTER — Other Ambulatory Visit: Payer: Medicare Other

## 2015-04-03 DIAGNOSIS — B20 Human immunodeficiency virus [HIV] disease: Secondary | ICD-10-CM | POA: Diagnosis not present

## 2015-04-03 DIAGNOSIS — Z79899 Other long term (current) drug therapy: Secondary | ICD-10-CM | POA: Diagnosis not present

## 2015-04-03 DIAGNOSIS — Z113 Encounter for screening for infections with a predominantly sexual mode of transmission: Secondary | ICD-10-CM

## 2015-04-03 LAB — COMPREHENSIVE METABOLIC PANEL
ALK PHOS: 83 U/L (ref 39–117)
ALT: 18 U/L (ref 0–53)
AST: 23 U/L (ref 0–37)
Albumin: 4 g/dL (ref 3.5–5.2)
BILIRUBIN TOTAL: 0.3 mg/dL (ref 0.2–1.2)
BUN: 13 mg/dL (ref 6–23)
CO2: 30 mEq/L (ref 19–32)
Calcium: 8.7 mg/dL (ref 8.4–10.5)
Chloride: 106 mEq/L (ref 96–112)
Creat: 0.82 mg/dL (ref 0.50–1.35)
GLUCOSE: 83 mg/dL (ref 70–99)
Potassium: 3.8 mEq/L (ref 3.5–5.3)
SODIUM: 141 meq/L (ref 135–145)
TOTAL PROTEIN: 6.9 g/dL (ref 6.0–8.3)

## 2015-04-03 LAB — LIPID PANEL
Cholesterol: 166 mg/dL (ref 0–200)
HDL: 46 mg/dL (ref >=40)
LDL Cholesterol: 102 mg/dL — ABNORMAL HIGH (ref 0–99)
Total CHOL/HDL Ratio: 3.6 Ratio
Triglycerides: 91 mg/dL (ref <150)
VLDL: 18 mg/dL (ref 0–40)

## 2015-04-03 LAB — CBC
HCT: 38.4 % — ABNORMAL LOW (ref 39.0–52.0)
Hemoglobin: 12.7 g/dL — ABNORMAL LOW (ref 13.0–17.0)
MCH: 28.2 pg (ref 26.0–34.0)
MCHC: 33.1 g/dL (ref 30.0–36.0)
MCV: 85.3 fL (ref 78.0–100.0)
MPV: 11 fL (ref 8.6–12.4)
PLATELETS: 283 10*3/uL (ref 150–400)
RBC: 4.5 MIL/uL (ref 4.22–5.81)
RDW: 13.8 % (ref 11.5–15.5)
WBC: 3.7 10*3/uL — AB (ref 4.0–10.5)

## 2015-04-04 LAB — HIV-1 RNA QUANT-NO REFLEX-BLD
HIV 1 RNA Quant: 53 copies/mL — ABNORMAL HIGH (ref <20)
HIV-1 RNA QUANT, LOG: 1.72 {Log} — AB (ref <1.30)

## 2015-04-04 LAB — T-HELPER CELL (CD4) - (RCID CLINIC ONLY)
CD4 % Helper T Cell: 46 % (ref 33–55)
CD4 T Cell Abs: 870 /uL (ref 400–2700)

## 2015-04-04 LAB — RPR

## 2015-04-05 ENCOUNTER — Ambulatory Visit (INDEPENDENT_AMBULATORY_CARE_PROVIDER_SITE_OTHER): Payer: Medicare Other | Admitting: Neurology

## 2015-04-05 ENCOUNTER — Encounter: Payer: Self-pay | Admitting: Neurology

## 2015-04-05 VITALS — BP 135/81 | HR 80 | Ht 75.0 in | Wt 162.0 lb

## 2015-04-05 DIAGNOSIS — G40109 Localization-related (focal) (partial) symptomatic epilepsy and epileptic syndromes with simple partial seizures, not intractable, without status epilepticus: Secondary | ICD-10-CM

## 2015-04-05 DIAGNOSIS — R569 Unspecified convulsions: Secondary | ICD-10-CM | POA: Diagnosis not present

## 2015-04-05 DIAGNOSIS — R269 Unspecified abnormalities of gait and mobility: Secondary | ICD-10-CM

## 2015-04-05 DIAGNOSIS — Z5181 Encounter for therapeutic drug level monitoring: Secondary | ICD-10-CM

## 2015-04-05 HISTORY — DX: Unspecified abnormalities of gait and mobility: R26.9

## 2015-04-05 HISTORY — DX: Localization-related (focal) (partial) symptomatic epilepsy and epileptic syndromes with simple partial seizures, not intractable, without status epilepticus: G40.109

## 2015-04-05 MED ORDER — PHENYTOIN SODIUM EXTENDED 100 MG PO CAPS: 200.0000 mg | ORAL_CAPSULE | Freq: Two times a day (BID) | ORAL | Status: DC

## 2015-04-05 MED ORDER — LEVETIRACETAM 500 MG PO TABS: 500.0000 mg | ORAL_TABLET | Freq: Two times a day (BID) | ORAL | Status: DC
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
## 2015-04-05 NOTE — Patient Instructions (Signed)

## 2015-04-06 ENCOUNTER — Telehealth: Payer: Self-pay | Admitting: Neurology

## 2015-04-06 LAB — PHENYTOIN LEVEL, TOTAL: Phenytoin Lvl: 22.2 ug/mL (ref 10.0–20.0)

## 2015-04-06 NOTE — Telephone Encounter (Signed)
I called the patient. The Dilantin level is slightly into the low toxic range at 22.2. The levels have been close to this previously. The patient indicates that he feels well, he is not staggery or sleepy. We will not alter the dosing on the medication.

## 2015-04-17 ENCOUNTER — Ambulatory Visit: Payer: Medicare Other | Admitting: Internal Medicine

## 2015-05-17 ENCOUNTER — Encounter: Payer: Self-pay | Admitting: Internal Medicine

## 2015-05-17 ENCOUNTER — Ambulatory Visit (INDEPENDENT_AMBULATORY_CARE_PROVIDER_SITE_OTHER): Payer: Medicare Other | Admitting: Internal Medicine

## 2015-05-17 DIAGNOSIS — B20 Human immunodeficiency virus [HIV] disease: Secondary | ICD-10-CM

## 2015-05-17 NOTE — Progress Notes (Signed)
Patient ID: Tristan [name], male   DOB: 1961/05/10, 54 y.o.   MRN: [id number]          Patient Active Problem List   Diagnosis Date Noted  . Human immunodeficiency virus (HIV) disease 02/05/2007    Priority: High  . 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 Gait disorder 04/05/2015  . HERPES ZOSTER, UNCOMPLICATED 83/38/2505  . HYPERLIPIDEMIA 02/05/2007  . ABUSE, ALCOHOL, UNSPECIFIED 02/05/2007  . DISORDER, TOBACCO USE 02/05/2007  . HEMIPARESIS 02/05/2007  . HYPERTENSION 02/05/2007  . Convulsions 02/05/2007    Patient's Medications  New Prescriptions   No medications on file  Previous Medications   ASPIRIN EC 81 MG TABLET    Take 81 mg by mouth daily.     ATRIPLA 600-200-300 MG PER TABLET    TAKE ONE TABLET BY MOUTH ONCE DAILY AT BEDTIME   CALCIUM CARBONATE (TUMS - DOSED IN MG ELEMENTAL CALCIUM) 500 MG CHEWABLE TABLET    Chew 1 tablet by mouth as needed for heartburn.   HYDROCHLOROTHIAZIDE (HYDRODIURIL) 50 MG TABLET    Take 1 tablet (50 mg total) by mouth daily.   LEVETIRACETAM (KEPPRA) 500 MG TABLET    Take 1 tablet (500 mg total) by mouth 2 (two) times daily.   MULTIPLE VITAMIN (MULTIVITAMIN) TABLET    Take 1 tablet by mouth daily.   PHENYTOIN (DILANTIN) 100 MG ER CAPSULE    Take 2 capsules (200 mg total) by mouth 2 (two) times daily. In the Morning and Evening  Modified Medications   No medications on file  Discontinued Medications   No medications on file    Subjective: Tristan [name] is in for his routine HIV follow-up visit. He states that he is doing well without any current problems or complaints. He denies missing any doses of Atripla and states that he tolerates it well. He has been out of his hydrochlorothiazide for about 3 months since he has not followed up with his primary care provider, Dr. Maudie [name]. He has a blood pressure cuff at home and states that his blood pressures have been normal. He has not had any seizures since his last visit.  Review of Systems: Pertinent items  are noted in HPI.  Past Medical History  Diagnosis Date  . Seizures   . Abnormality of gait 04/04/2013  . ALLERGIC RHINITIS 02/05/2007    Qualifier: Diagnosis of  By: Tristan Salon MD, Tristan [name]    . ACHALASIA 02/05/2007    Annotation: Botox injection 2004 Qualifier: Diagnosis of  By: Tristan Salon MD, Tristan [name]    . GERD 09/28/2007    Qualifier: Diagnosis of  By: Tristan Salon MD, Tristan [name]    . Dysphagia, oral phase 02/05/2007    Qualifier: Diagnosis of  By: Tristan Salon MD, Tristan [name]    . HIP FRACTURE, LEFT 02/05/2007    Annotation: 1/04, s/p ORIF Qualifier: Diagnosis of  By: Tristan Salon MD, Tristan [name]    . GYNECOMASTIA 02/05/2007    Annotation: transient, while on HART Qualifier: Diagnosis of  By: Tristan Salon MD, Tristan [name]    . PNEUMONIA, HX OF 02/05/2007    Annotation: with pneumothorax 1991 Qualifier: Diagnosis of  By: Tristan Salon MD, Tristan [name]    . LAPAROSCOPIC MYOTOMY AND FUNDOPLICATION 3/97/6734    Annotation: 8/10 Qualifier: Diagnosis of  By: Tristan Salon MD, Tristan [name]    . Chicken pox   . Seasonal allergies   . Hypertension   . Seizure   . Head injury     10/1990  . HIV infection   . 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  Gait disorder 04/05/2015    History  Substance Use Topics  . Smoking status: Former Smoker -- 0.50 packs/day for 23 years    Types: Cigarettes    Quit date: 06/21/1997  . Smokeless tobacco: Never Used  . Alcohol Use: No    Family History  Problem Relation Age of Onset  . Colon cancer Mother 21  . Hypertension Father   . Stroke Father     No Known Allergies  Objective: Temp: 97.9 F (36.6 C) (05/26 0941) Temp Source: Oral (05/26 0941) BP: 136/79 mmHg (05/26 0941) Pulse Rate: 64 (05/26 0941) Body mass index is 20.19 kg/(m^2).  General: He is smiling and in good spirits Oral: No oropharyngeal lesions Skin: No rash Lungs: Clear Cor: Regular S1 and S2 with no murmurs  Lab Results Lab Results  Component Value Date   WBC 3.7* 04/03/2015   HGB 12.7* 04/03/2015   HCT 38.4* 04/03/2015   MCV 85.3  04/03/2015   PLT 283 04/03/2015    Lab Results  Component Value Date   CREATININE 0.82 04/03/2015   BUN 13 04/03/2015   NA 141 04/03/2015   K 3.8 04/03/2015   CL 106 04/03/2015   CO2 30 04/03/2015    Lab Results  Component Value Date   ALT 18 04/03/2015   AST 23 04/03/2015   ALKPHOS 83 04/03/2015   BILITOT 0.3 04/03/2015    Lab Results  Component Value Date   CHOL 166 04/03/2015   HDL 46 04/03/2015   LDLCALC 102* 04/03/2015   TRIG 91 04/03/2015   CHOLHDL 3.6 04/03/2015    Lab Results HIV 1 RNA QUANT (copies/mL)  Date Value  04/03/2015 53*  09/28/2014 <20  01/25/2014 <20   CD4 T CELL ABS (/uL)  Date Value  04/03/2015 870  09/28/2014 1370  01/25/2014 860     Assessment: His HIV infection remains under good control. He has slight viral activation but I suspect that this is just a blip and I will continue Atripla.  Plan: 1. Continue Atripla 2. Follow-up here after blood work in [place] months   Tristan Bickers, MD [facility] for [place] [phone] pager   [phone] cell 05/17/2015, 10:02 AM

## 2015-06-04 ENCOUNTER — Other Ambulatory Visit: Payer: Self-pay | Admitting: Internal Medicine

## 2015-11-01 ENCOUNTER — Other Ambulatory Visit: Payer: Self-pay | Admitting: Internal Medicine

## 2015-11-06 ENCOUNTER — Other Ambulatory Visit: Payer: Self-pay

## 2015-11-06 DIAGNOSIS — B20 Human immunodeficiency virus [HIV] disease: Secondary | ICD-10-CM

## 2015-11-06 LAB — COMPREHENSIVE METABOLIC PANEL
ALT: 24 U/L (ref 9–46)
AST: 28 U/L (ref 10–35)
Albumin: 4.1 g/dL (ref 3.6–5.1)
Alkaline Phosphatase: 81 U/L (ref 40–115)
BUN: 10 mg/dL (ref 7–25)
CHLORIDE: 105 mmol/L (ref 98–110)
CO2: 27 mmol/L (ref 20–31)
Calcium: 8.7 mg/dL (ref 8.6–10.3)
Creat: 0.67 mg/dL — ABNORMAL LOW (ref 0.70–1.33)
Glucose, Bld: 65 mg/dL (ref 65–99)
Potassium: 4.3 mmol/L (ref 3.5–5.3)
SODIUM: 140 mmol/L (ref 135–146)
TOTAL PROTEIN: 6.9 g/dL (ref 6.1–8.1)
Total Bilirubin: 0.2 mg/dL (ref 0.2–1.2)

## 2015-11-06 LAB — CBC
HCT: 37.8 % — ABNORMAL LOW (ref 39.0–52.0)
Hemoglobin: 12.4 g/dL — ABNORMAL LOW (ref 13.0–17.0)
MCH: 28.4 pg (ref 26.0–34.0)
MCHC: 32.8 g/dL (ref 30.0–36.0)
MCV: 86.5 fL (ref 78.0–100.0)
MPV: 11 fL (ref 8.6–12.4)
PLATELETS: 255 10*3/uL (ref 150–400)
RBC: 4.37 MIL/uL (ref 4.22–5.81)
RDW: 14.6 % (ref 11.5–15.5)
WBC: 5.4 10*3/uL (ref 4.0–10.5)

## 2015-11-07 LAB — T-HELPER CELL (CD4) - (RCID CLINIC ONLY)
CD4 T CELL ABS: 1060 /uL (ref 400–2700)
CD4 T CELL HELPER: 44 % (ref 33–55)

## 2015-11-09 LAB — HIV-1 RNA QUANT-NO REFLEX-BLD
HIV 1 RNA Quant: 49 copies/mL — ABNORMAL HIGH (ref <20)
HIV-1 RNA QUANT, LOG: 1.69 {Log_copies}/mL — AB (ref <1.30)

## 2015-11-22 ENCOUNTER — Encounter: Payer: Self-pay | Admitting: Internal Medicine

## 2015-11-22 ENCOUNTER — Ambulatory Visit (INDEPENDENT_AMBULATORY_CARE_PROVIDER_SITE_OTHER): Payer: Medicare Other | Admitting: Internal Medicine

## 2015-11-22 VITALS — BP 181/81 | HR 67 | Temp 97.1°F | Wt 163.0 lb

## 2015-11-22 DIAGNOSIS — Z79899 Other long term (current) drug therapy: Secondary | ICD-10-CM

## 2015-11-22 DIAGNOSIS — Z23 Encounter for immunization: Secondary | ICD-10-CM

## 2015-11-22 DIAGNOSIS — G40109 Localization-related (focal) (partial) symptomatic epilepsy and epileptic syndromes with simple partial seizures, not intractable, without status epilepticus: Secondary | ICD-10-CM

## 2015-11-22 DIAGNOSIS — I10 Essential (primary) hypertension: Secondary | ICD-10-CM | POA: Diagnosis not present

## 2015-11-22 DIAGNOSIS — B20 Human immunodeficiency virus [HIV] disease: Secondary | ICD-10-CM

## 2015-11-22 MED ORDER — HYDROCHLOROTHIAZIDE 50 MG PO TABS: 50.0000 mg | ORAL_TABLET | Freq: Every day | ORAL | Status: DC

## 2015-11-22 MED ORDER — EFAVIRENZ-EMTRICITAB-TENOFOVIR 600-200-300 MG PO TABS: 1.0000 | ORAL_TABLET | Freq: Every day | ORAL | Status: DC

## 2015-11-22 NOTE — Assessment & Plan Note (Signed)
His infection remains under relatively good control. He's had low-level viral activation on his 2 last blood draws. I reviewed newer and safer antiretroviral alternatives. Unfortunately many antiretrovirals interact with Dilantin leading to subtherapeutic antiviral concentrations. I will check his HLA [id number] and consider treating him with Triumeq each morning and Tivicay each evening. He will continue Atripla for now. He will follow-up after blood work in 6 months. He was given influenza vaccination today.

## 2015-11-22 NOTE — Assessment & Plan Note (Signed)
His blood pressure is elevated now that he is off hydrochlorothiazide. I will give him refills and I have encouraged him to follow-up with Dr. Maudie Mercury.

## 2015-11-22 NOTE — Assessment & Plan Note (Signed)
Fortunately his seizure disorder remains under control. He will continue Dilantin and Keppra.

## 2015-11-22 NOTE — Progress Notes (Addendum)
Patient Active Problem List   Diagnosis Date Noted  . Human immunodeficiency virus (HIV) disease ([place]) 02/05/2007    Priority: High  . 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 Gait disorder 04/05/2015  . HERPES ZOSTER, UNCOMPLICATED 79/15/0569  . HYPERLIPIDEMIA 02/05/2007  . ABUSE, ALCOHOL, UNSPECIFIED 02/05/2007  . DISORDER, TOBACCO USE 02/05/2007  . HEMIPARESIS 02/05/2007  . Essential hypertension 02/05/2007  . Convulsions ([place]) 02/05/2007    Patient's Medications  New Prescriptions   No medications on file  Previous Medications   ASPIRIN EC 81 MG TABLET    Take 81 mg by mouth daily.     CALCIUM CARBONATE (TUMS - DOSED IN MG ELEMENTAL CALCIUM) 500 MG CHEWABLE TABLET    Chew 1 tablet by mouth as needed for heartburn.   LEVETIRACETAM (KEPPRA) 500 MG TABLET    Take 1 tablet (500 mg total) by mouth 2 (two) times daily.   MULTIPLE VITAMIN (MULTIVITAMIN) TABLET    Take 1 tablet by mouth daily.   PHENYTOIN (DILANTIN) 100 MG ER CAPSULE    Take 2 capsules (200 mg total) by mouth 2 (two) times daily. In the Morning and Evening  Modified Medications   Modified Medication Previous Medication   EFAVIRENZ-EMTRICITABINE-TENOFOVIR (ATRIPLA) 600-200-300 MG TABLET ATRIPLA 600-200-300 MG tablet      Take 1 tablet by mouth daily with breakfast.    TAKE ONE TABLET BY MOUTH ONCE DAILY AT BEDTIME   HYDROCHLOROTHIAZIDE (HYDRODIURIL) 50 MG TABLET hydrochlorothiazide (HYDRODIURIL) 50 MG tablet      Take 1 tablet (50 mg total) by mouth daily.    Take 1 tablet (50 mg total) by mouth daily.  Discontinued Medications   No medications on file    Subjective: Tristan [name] is in for his routine visit. He has some daytime drowsiness that he attributes to his Atripla and Dilantin but otherwise tolerates it well. He takes it each evening before bedtime and never misses doses. He states that I have a "routine".  He has not seen his primary care physician, Dr. Maudie Mercury, in over 2 years. His  hydrochlorothiazide ran out and he has been off of it for quite some time. He is worried about his blood pressure being elevated here today. Otherwise he is doing well without complaint. Several months ago he was walking home from [place] when a good [employer]aritan stopped and gave him a ride. Several weeks later he stopped by his house and told him that his church wanted to donate an electric scooter to him. He says that that has made a world of difference for him. He has not had any seizures since his last visit.  Review of Systems: Review of Systems  Constitutional: Negative for fever, chills, weight loss, malaise/fatigue and diaphoresis.  HENT: Negative for sore throat.   Respiratory: Negative for cough, sputum production and shortness of breath.   Cardiovascular: Negative for chest pain.  Gastrointestinal: Negative for nausea, vomiting and diarrhea.  Genitourinary: Negative for dysuria and frequency.  Musculoskeletal: Negative for myalgias and joint pain.  Skin: Negative for rash.  Neurological: Positive for focal weakness.  Psychiatric/Behavioral: Negative for depression and substance abuse. The patient is not nervous/anxious.     Past Medical History  Diagnosis Date  . Seizures ([place])   . Abnormality of gait 04/04/2013  . ALLERGIC RHINITIS 02/05/2007    Qualifier: Diagnosis of  By: [name], Latitia Housewright    . ACHALASIA 02/05/2007    Annotation: Botox injection 2004 Qualifier: Diagnosis  of  By: [name], Jenny Reichmann    . GERD 09/28/2007    Qualifier: Diagnosis of  By: [name], Lucetta Baehr    . Dysphagia, oral phase 02/05/2007    Qualifier: Diagnosis of  By: [name], Majestic Molony    . HIP FRACTURE, LEFT 02/05/2007    Annotation: 1/04, s/p ORIF Qualifier: Diagnosis of  By: [name], Aubrionna Istre    . GYNECOMASTIA 02/05/2007    Annotation: transient, while on HART Qualifier: Diagnosis of  By: [name], Nellie Chevalier    . PNEUMONIA, HX OF 02/05/2007    Annotation: with pneumothorax 1991 Qualifier: Diagnosis of  By:  [name], Saima Monterroso    . LAPAROSCOPIC MYOTOMY AND FUNDOPLICATION 2/63/7858    Annotation: 8/10 Qualifier: Diagnosis of  By: [name], Pinkey Mcjunkin    . Chicken pox   . Seasonal allergies   . Hypertension   . Seizure ([place])   . Head injury     10/1990  . HIV infection ([place])   . 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 Gait disorder 04/05/2015    Social History  Substance Use Topics  . Smoking status: Former Smoker -- 0.50 packs/day for 23 years    Types: Cigarettes    Quit date: 06/21/1997  . Smokeless tobacco: Never Used  . Alcohol Use: No    Family History  Problem Relation Age of Onset  . Colon cancer Mother 18  . Hypertension Father   . Stroke Father     No Known Allergies  Objective:  Filed Vitals:   11/22/15 1356  BP: 181/81  Pulse: 67  Temp: 97.1 F (36.2 C)  TempSrc: Oral  Weight: 163 lb (73.936 kg)   Body mass index is 20.37 kg/(m^2).  Physical Exam  Constitutional: He is oriented to person, place, and time.  He is well dressed with his [employer] jacket on. He is pleasant and in good spirits as usual.  HENT:  Mouth/Throat: No oropharyngeal exudate.  Eyes: Conjunctivae are normal.  Cardiovascular: Normal rate and regular rhythm.   No murmur heard. Pulmonary/Chest: Breath sounds normal.  Abdominal: Soft. He exhibits no mass. There is no tenderness.  Musculoskeletal: Normal range of motion.  Neurological: He is alert and oriented to person, place, and time.  He walks with a cane following his remote brain injury.  Skin: No rash noted.  Psychiatric: Mood and affect normal.    Lab Results Lab Results  Component Value Date   WBC 5.4 11/06/2015   HGB 12.4* 11/06/2015   HCT 37.8* 11/06/2015   MCV 86.5 11/06/2015   PLT 255 11/06/2015    Lab Results  Component Value Date   CREATININE 0.67* 11/06/2015   BUN 10 11/06/2015   NA 140 11/06/2015   K 4.3 11/06/2015   CL 105 11/06/2015   CO2 27 11/06/2015    Lab Results  Component Value Date     ALT 24 11/06/2015   AST 28 11/06/2015   ALKPHOS 81 11/06/2015   BILITOT 0.2 11/06/2015    Lab Results  Component Value Date   CHOL 166 04/03/2015   HDL 46 04/03/2015   LDLCALC 102* 04/03/2015   TRIG 91 04/03/2015   CHOLHDL 3.6 04/03/2015    Lab Results HIV 1 RNA QUANT (copies/mL)  Date Value  11/06/2015 49*  04/03/2015 53*  09/28/2014 <20   CD4 T CELL ABS (/uL)  Date Value  11/06/2015 1060  04/03/2015 870  09/28/2014 1370      Problem List Items Addressed This Visit  High   Human immunodeficiency virus (HIV) disease ([place])    His infection remains under relatively good control. He's had low-level viral activation on his 2 last blood draws. I reviewed newer and safer antiretroviral alternatives. Unfortunately many antiretrovirals interact with Dilantin leading to subtherapeutic antiviral concentrations. I will check his HLA B5701 and consider treating him with Triumeq each morning and Tivicay each evening. He will continue Atripla for now. He will follow-up after blood work in 6 months. He was given influenza vaccination today.      Relevant Medications   efavirenz-emtricitabine-tenofovir (ATRIPLA) 600-200-300 MG tablet   Other Relevant Orders   T-helper cell (CD4)- ([facility] clinic only)   HIV 1 RNA quant-no reflex-bld   CBC   Comprehensive metabolic panel   Lipid panel   RPR   HLA B*5701     Unprioritized   Essential hypertension - Primary    His blood pressure is elevated now that he is off hydrochlorothiazide. I will give him refills and I have encouraged him to follow-up with Dr. Maudie Mercury.      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 He will continue Dilantin and Keppra.       Other Visit Diagnoses    Polypharmacy        Relevant Orders    Lipid panel         Michel Bickers, MD [facility] [place] [facility] [phone] pager   [phone] cell 11/22/2015, 2:39 PM   Addendum:  HLA B5701 11/22/2015: Negative  I spoke with Tristan [name] today by phone and will go ahead and change his Atripla to Triumeq each morning and Tivicay each evening.  Michel Bickers, MD [facility] [place] [facility] [phone] pager   [phone] cell 12/12/2015, 10:46 AM

## 2015-11-29 LAB — HLA B*5701: HLA-B 5701 W/RFLX HLA-B HIGH: NEGATIVE

## 2015-12-12 MED ORDER — ABACAVIR-DOLUTEGRAVIR-LAMIVUD 600-50-300 MG PO TABS: 1.0000 | ORAL_TABLET | Freq: Every morning | ORAL | Status: DC

## 2015-12-12 MED ORDER — DOLUTEGRAVIR SODIUM 50 MG PO TABS: 50.0000 mg | ORAL_TABLET | Freq: Every evening | ORAL | Status: DC

## 2015-12-12 NOTE — Addendum Note (Signed)
Addended by: Michel Bickers on: 12/12/2015 10:48 AM   Modules accepted: Orders, Medications

## 2016-04-04 ENCOUNTER — Ambulatory Visit (INDEPENDENT_AMBULATORY_CARE_PROVIDER_SITE_OTHER): Payer: Medicare Other | Admitting: Neurology

## 2016-04-04 ENCOUNTER — Encounter: Payer: Self-pay | Admitting: Neurology

## 2016-04-04 VITALS — BP 122/77 | HR 78 | Ht 75.0 in | Wt 168.0 lb

## 2016-04-04 DIAGNOSIS — R569 Unspecified convulsions: Secondary | ICD-10-CM

## 2016-04-04 DIAGNOSIS — R269 Unspecified abnormalities of gait and mobility: Secondary | ICD-10-CM | POA: Diagnosis not present

## 2016-04-04 DIAGNOSIS — Z5181 Encounter for therapeutic drug level monitoring: Secondary | ICD-10-CM

## 2016-04-04 MED ORDER — PHENYTOIN SODIUM EXTENDED 100 MG PO CAPS: 200.0000 mg | ORAL_CAPSULE | Freq: Two times a day (BID) | ORAL | Status: DC

## 2016-04-04 MED ORDER — LEVETIRACETAM 500 MG PO TABS: 500.0000 mg | ORAL_TABLET | Freq: Two times a day (BID) | ORAL | Status: DC
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
## 2016-04-05 LAB — COMPREHENSIVE METABOLIC PANEL
A/G RATIO: 1.5 (ref 1.2–2.2)
ALBUMIN: 4.4 g/dL (ref 3.5–5.5)
ALT: 20 IU/L (ref 0–44)
AST: 25 IU/L (ref 0–40)
Alkaline Phosphatase: 85 IU/L (ref 39–117)
BUN/Creatinine Ratio: 8 — ABNORMAL LOW (ref 9–20)
BUN: 8 mg/dL (ref 6–24)
Bilirubin Total: <0.2 mg/dL (ref 0.0–1.2)
CALCIUM: 9 mg/dL (ref 8.7–10.2)
CO2: 23 mmol/L (ref 18–29)
CREATININE: 0.95 mg/dL (ref 0.76–1.27)
Chloride: 99 mmol/L (ref 96–106)
GFR, EST AFRICAN AMERICAN: 105 mL/min/{1.73_m2} (ref >59)
GFR, EST NON AFRICAN AMERICAN: 91 mL/min/{1.73_m2} (ref >59)
GLOBULIN, TOTAL: 2.9 g/dL (ref 1.5–4.5)
Glucose: 154 mg/dL — ABNORMAL HIGH (ref 65–99)
POTASSIUM: 3.7 mmol/L (ref 3.5–5.2)
SODIUM: 141 mmol/L (ref 134–144)
TOTAL PROTEIN: 7.3 g/dL (ref 6.0–8.5)

## 2016-04-05 LAB — CBC WITH DIFFERENTIAL/PLATELET
Basophils Absolute: 0 10*3/uL (ref 0.0–0.2)
Basos: 0 %
EOS (ABSOLUTE): 0 10*3/uL (ref 0.0–0.4)
EOS: 1 %
HEMATOCRIT: 39.3 % (ref 37.5–51.0)
HEMOGLOBIN: 13.3 g/dL (ref 12.6–17.7)
IMMATURE GRANULOCYTES: 0 %
Immature Grans (Abs): 0 10*3/uL (ref 0.0–0.1)
LYMPHS ABS: 2.8 10*3/uL (ref 0.7–3.1)
Lymphs: 44 %
MCH: 28.3 pg (ref 26.6–33.0)
MCHC: 33.8 g/dL (ref 31.5–35.7)
MCV: 84 fL (ref 79–97)
MONOCYTES: 9 %
Monocytes Absolute: 0.6 10*3/uL (ref 0.1–0.9)
NEUTROS PCT: 46 %
Neutrophils Absolute: 2.9 10*3/uL (ref 1.4–7.0)
Platelets: 199 10*3/uL (ref 150–379)
RBC: 4.7 x10E6/uL (ref 4.14–5.80)
RDW: 14.2 % (ref 12.3–15.4)
WBC: 6.3 10*3/uL (ref 3.4–10.8)

## 2016-04-05 LAB — PHENYTOIN LEVEL, TOTAL: PHENYTOIN (DILANTIN), SERUM: 10.8 ug/mL (ref 10.0–20.0)

## 2016-04-07 ENCOUNTER — Telehealth: Payer: Self-pay

## 2016-04-07 NOTE — Telephone Encounter (Signed)
Called pt, no answer. Left mssg saying that labs were unremarkable. Continue seizure medication as prescribed. May call back w/ questions/concerns.

## 2016-05-09 ENCOUNTER — Telehealth: Payer: Self-pay | Admitting: *Deleted

## 2016-05-09 NOTE — Telephone Encounter (Signed)
Call from [facility] with [facility] Dept requesting last office note and labs. Note and labs faxed; confirmation received. Patient was listed as a contact for TB in an ongoing investigation. They are requesting that the lab results and upcoming office note be faxed as well to [phone]. She will call back in June if not received. Tristan [name]

## 2016-05-14 ENCOUNTER — Other Ambulatory Visit: Payer: Self-pay | Admitting: Infectious Disease

## 2016-05-14 ENCOUNTER — Ambulatory Visit
Admission: RE | Admit: 2016-05-14 | Discharge: 2016-05-14 | Disposition: A | Discharge status: Home / Self Care | Payer: No Typology Code available for payment source | Source: Ambulatory Visit | Attending: Infectious Disease | Admitting: Infectious Disease

## 2016-05-14 DIAGNOSIS — R7611 Nonspecific reaction to tuberculin skin test without active tuberculosis: Secondary | ICD-10-CM

## 2016-05-22 ENCOUNTER — Other Ambulatory Visit: Payer: Self-pay

## 2016-05-22 DIAGNOSIS — Z79899 Other long term (current) drug therapy: Secondary | ICD-10-CM | POA: Diagnosis not present

## 2016-05-22 DIAGNOSIS — B20 Human immunodeficiency virus [HIV] disease: Secondary | ICD-10-CM

## 2016-05-22 LAB — COMPREHENSIVE METABOLIC PANEL
ALBUMIN: 4.3 g/dL (ref 3.6–5.1)
ALK PHOS: 80 U/L (ref 40–115)
ALT: 19 U/L (ref 9–46)
AST: 25 U/L (ref 10–35)
BILIRUBIN TOTAL: 0.2 mg/dL (ref 0.2–1.2)
BUN: 9 mg/dL (ref 7–25)
CALCIUM: 8.7 mg/dL (ref 8.6–10.3)
CO2: 27 mmol/L (ref 20–31)
Chloride: 101 mmol/L (ref 98–110)
Creat: 1.01 mg/dL (ref 0.70–1.33)
GLUCOSE: 95 mg/dL (ref 65–99)
POTASSIUM: 3.6 mmol/L (ref 3.5–5.3)
Sodium: 141 mmol/L (ref 135–146)
TOTAL PROTEIN: 7.1 g/dL (ref 6.1–8.1)

## 2016-05-22 LAB — CBC
HEMATOCRIT: 38.8 % (ref 38.5–50.0)
HEMOGLOBIN: 12.8 g/dL — AB (ref 13.2–17.1)
MCH: 28.6 pg (ref 27.0–33.0)
MCHC: 33 g/dL (ref 32.0–36.0)
MCV: 86.6 fL (ref 80.0–100.0)
MPV: 11.7 fL (ref 7.5–12.5)
Platelets: 208 10*3/uL (ref 140–400)
RBC: 4.48 MIL/uL (ref 4.20–5.80)
RDW: 14.4 % (ref 11.0–15.0)
WBC: 5.5 10*3/uL (ref 3.8–10.8)

## 2016-05-22 LAB — LIPID PANEL
CHOL/HDL RATIO: 5 ratio (ref <=5.0)
CHOLESTEROL: 209 mg/dL — AB (ref 125–200)
HDL: 42 mg/dL (ref >=40)
TRIGLYCERIDES: 428 mg/dL — AB (ref <150)

## 2016-05-23 LAB — T-HELPER CELL (CD4) - (RCID CLINIC ONLY)
CD4 T CELL ABS: 1370 /uL (ref 400–2700)
CD4 T CELL HELPER: 43 % (ref 33–55)

## 2016-05-23 LAB — RPR

## 2016-05-26 LAB — HIV-1 RNA QUANT-NO REFLEX-BLD
HIV 1 RNA QUANT: 98 {copies}/mL — AB (ref <20)
HIV-1 RNA QUANT, LOG: 1.99 {Log_copies}/mL — AB (ref <1.30)

## 2016-06-02 ENCOUNTER — Telehealth: Payer: Self-pay | Admitting: Neurology

## 2016-06-02 NOTE — Telephone Encounter (Signed)
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 Jannifer Franklin has prescribed, will need dilantin levels checked periodically. Please call.

## 2016-06-03 ENCOUNTER — Telehealth: Payer: Self-pay

## 2016-06-03 DIAGNOSIS — R569 Unspecified convulsions: Secondary | ICD-10-CM

## 2016-06-03 NOTE — Telephone Encounter (Signed)
Will plan on getting another dilantin level in 4 to 6 weeks after starting INH, sooner if the patient is having symptoms of toxicity.

## 2016-06-03 NOTE — Telephone Encounter (Signed)
Order placed

## 2016-06-03 NOTE — Telephone Encounter (Signed)
This is the message that I received yesterday for Dr. 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 Jannifer Franklin has prescribed, will need dilantin levels checked periodically. Please call."  I called Renee today and they recommend getting baseline Dilantin level check before starting this medication. Ok to order that under your name?  Patient will be on this medication for 9 months. I will talk to Dr. Jannifer Franklin on how often he suggests checking levels after starting medication.

## 2016-06-03 NOTE — Telephone Encounter (Signed)
I spoke to patient and he is aware to stop by and get some blood work done.

## 2016-06-03 NOTE — Telephone Encounter (Signed)
Yes please order thanks Dianna

## 2016-06-03 NOTE — Telephone Encounter (Signed)
I spoke to Tristan [name]. They would like to start this patient on INH asap. They recommend getting a baseline Dilantin level (for which I have asked Dr. Jaynee Eagles about ordering). Joseph Art is aware that you are out of the office this week. They ask to know your plan on how often patient will have Dilantin level checked while on Isoniazid.

## 2016-06-03 NOTE — Addendum Note (Signed)
Addended by: Laurence Spates on: 06/03/2016 01:49 PM   Modules accepted: Orders

## 2016-06-05 ENCOUNTER — Encounter: Payer: Self-pay | Admitting: Internal Medicine

## 2016-06-05 ENCOUNTER — Ambulatory Visit (INDEPENDENT_AMBULATORY_CARE_PROVIDER_SITE_OTHER): Payer: Medicare Other | Admitting: Internal Medicine

## 2016-06-05 ENCOUNTER — Other Ambulatory Visit (INDEPENDENT_AMBULATORY_CARE_PROVIDER_SITE_OTHER): Payer: Self-pay

## 2016-06-05 ENCOUNTER — Telehealth: Payer: Self-pay | Admitting: *Deleted

## 2016-06-05 ENCOUNTER — Other Ambulatory Visit: Payer: Self-pay | Admitting: *Deleted

## 2016-06-05 VITALS — BP 147/82 | HR 65 | Temp 97.7°F | Ht 75.0 in | Wt 172.0 lb

## 2016-06-05 DIAGNOSIS — B20 Human immunodeficiency virus [HIV] disease: Secondary | ICD-10-CM | POA: Diagnosis not present

## 2016-06-05 DIAGNOSIS — R569 Unspecified convulsions: Secondary | ICD-10-CM | POA: Diagnosis not present

## 2016-06-05 DIAGNOSIS — R799 Abnormal finding of blood chemistry, unspecified: Secondary | ICD-10-CM

## 2016-06-05 DIAGNOSIS — Z79899 Other long term (current) drug therapy: Secondary | ICD-10-CM

## 2016-06-05 DIAGNOSIS — Z227 Latent tuberculosis: Secondary | ICD-10-CM

## 2016-06-05 DIAGNOSIS — Z0289 Encounter for other administrative examinations: Secondary | ICD-10-CM

## 2016-06-05 NOTE — Telephone Encounter (Signed)
Pt called to share the name of his TB RN, Joie Bimler, RN, [place] Department.  Phone number = [phone]

## 2016-06-05 NOTE — Assessment & Plan Note (Signed)
His infection remains under reasonably good control. He will continue his current antiretroviral regimen and followup after lab work in 6 months

## 2016-06-05 NOTE — Assessment & Plan Note (Signed)
I will work with Onnie Boer, our ID pharmacist, to review all potential drug drug interactions and coordinate therapy for latent tuberculosis with the health department.

## 2016-06-05 NOTE — Telephone Encounter (Signed)
I spoke to [facility] at [place] (P# [phone]). I advised her that baseline Dilantin level was taken today. She asks if those results can be faxed to her at [phone].   Patient will need recheck in 4-6 weeks. Order is not in yet. Patient has not been notified of recheck date. Renee asks when that level is taken, if that can also be faxed to her.
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
## 2016-06-06 LAB — PHENYTOIN LEVEL, TOTAL: PHENYTOIN (DILANTIN), SERUM: 12.6 ug/mL (ref 10.0–20.0)

## 2016-06-06 NOTE — Telephone Encounter (Signed)
Baseline Dilantin level results faxed to [facility] F # [phone].

## 2016-06-12 ENCOUNTER — Telehealth: Payer: Self-pay | Admitting: *Deleted

## 2016-06-12 ENCOUNTER — Telehealth: Payer: Self-pay | Admitting: Neurology

## 2016-06-12 NOTE — Telephone Encounter (Signed)
Called Renee back and her med list is the same as what we have in EPIC. Myrtis Hopping

## 2016-06-12 NOTE — Telephone Encounter (Signed)
Tristan [name] with [facility] Dept is calling and just wanted you to know about new medications this patient has been given for latent TB.  They are isoniazid 300 mg daily for 9 months and B6 25 mg daily for 9 months.  This info for info purposes only.  Call only if you need to discuss.  Thanks!

## 2016-06-12 NOTE — Telephone Encounter (Signed)
Would you please ask Tristan [name] if his Dilantin dose or the dose of any other medications have been adjusted?

## 2016-06-12 NOTE — Telephone Encounter (Signed)
Noted. med list updated.

## 2016-06-12 NOTE — Telephone Encounter (Signed)
Call from [facility] with [facility] to update patient's medications. He is on Isoniazid and B6 for the next nine months. She is aware that patient's next visit with Dr. Megan Salon is for 12/09/16. Myrtis Hopping

## 2016-07-10 DIAGNOSIS — R7611 Nonspecific reaction to tuberculin skin test without active tuberculosis: Secondary | ICD-10-CM | POA: Diagnosis not present

## 2016-07-11 ENCOUNTER — Telehealth: Payer: Self-pay | Admitting: Neurology

## 2016-07-11 DIAGNOSIS — Z5181 Encounter for therapeutic drug level monitoring: Secondary | ICD-10-CM

## 2016-07-11 NOTE — Telephone Encounter (Signed)
I called patient. The patient is to come in to get the Dilantin level checked, he has been placed on INH which may have an interaction with this medication.

## 2016-07-18 NOTE — Telephone Encounter (Signed)
Most recent lab results faxed to [facility] @ [place]. Last recorded Dilantin level was 12.6 in June. Called pt to remind him of lab re-check this month. Said that he would try to come in this morning. If not able to come today, agreed to have labs on Monday. Stressed importance of monitoring Dilantin level due to his other meds and to prevent seizures. Verbalized understanding and appreciation for call.

## 2016-07-18 NOTE — Telephone Encounter (Signed)
Renee/[facility] [phone], called to inquire if labwork was done for Dilantin level, states Medical Director will be reviewing patient's chart on Monday, they prescribe a medication that can affect the Dilantin levels. Please call to advise or Fax result to [phone].

## 2016-07-22 ENCOUNTER — Other Ambulatory Visit (INDEPENDENT_AMBULATORY_CARE_PROVIDER_SITE_OTHER): Payer: Self-pay

## 2016-07-22 DIAGNOSIS — Z5181 Encounter for therapeutic drug level monitoring: Secondary | ICD-10-CM | POA: Diagnosis not present

## 2016-07-22 DIAGNOSIS — Z0289 Encounter for other administrative examinations: Secondary | ICD-10-CM

## 2016-07-23 ENCOUNTER — Telehealth: Payer: Self-pay

## 2016-07-23 LAB — PHENYTOIN LEVEL, TOTAL: PHENYTOIN (DILANTIN), SERUM: 13.8 ug/mL (ref 10.0–20.0)

## 2016-07-23 NOTE — Telephone Encounter (Signed)
Called pt w/ normal Dilantin level. Instructed to continue current medication. Verbalized understanding and appreciation for call.

## 2016-07-23 NOTE — Telephone Encounter (Signed)
-----   Message from Kathrynn Ducking, MD sent at 07/23/2016  7:53 AM EDT -----  Dilantin level is therapeutic, no change in dosing. ----- Message ----- From: Interface, [facility] Lab Results In Sent: 07/23/2016   7:43 AM To: Kathrynn Ducking, MD

## 2016-08-06 NOTE — Telephone Encounter (Signed)
Renee with the Health Dept is calling to get the patient's Dilantin levels faxed to [phone]. Please call if there are any questions.

## 2016-08-06 NOTE — Telephone Encounter (Signed)
Lab results faxed to [facility] at [facility] Dept as requested.

## 2016-08-11 DIAGNOSIS — R7611 Nonspecific reaction to tuberculin skin test without active tuberculosis: Secondary | ICD-10-CM | POA: Diagnosis not present

## 2016-09-10 DIAGNOSIS — R7611 Nonspecific reaction to tuberculin skin test without active tuberculosis: Secondary | ICD-10-CM | POA: Diagnosis not present

## 2016-09-24 ENCOUNTER — Telehealth: Payer: Self-pay | Admitting: Neurology

## 2016-09-24 NOTE — Telephone Encounter (Signed)
Returned call to [employer] at the [employer]. Relayed most recent Dilantin level from 07/22/16 which was WNL (13.6). May call back w/ additional questions/concerns.

## 2016-09-24 NOTE — Telephone Encounter (Signed)
Renee/ [facility] Department called requesting recent Dilantin level lab work. Please call [phone]

## 2016-10-08 ENCOUNTER — Other Ambulatory Visit (INDEPENDENT_AMBULATORY_CARE_PROVIDER_SITE_OTHER): Payer: Self-pay

## 2016-10-08 ENCOUNTER — Telehealth: Payer: Self-pay

## 2016-10-08 ENCOUNTER — Other Ambulatory Visit: Payer: Self-pay

## 2016-10-08 DIAGNOSIS — Z5181 Encounter for therapeutic drug level monitoring: Secondary | ICD-10-CM

## 2016-10-08 DIAGNOSIS — Z0289 Encounter for other administrative examinations: Secondary | ICD-10-CM

## 2016-10-08 NOTE — Telephone Encounter (Signed)
Patient came in requesting Phenytoin levels to be checked, no pending order. Dr. Rexene Alberts is work in doctor and will order labs. Looks like patient gets this checked every 2 months.

## 2016-10-09 ENCOUNTER — Telehealth: Payer: Self-pay

## 2016-10-09 LAB — PHENYTOIN LEVEL, TOTAL: PHENYTOIN (DILANTIN), SERUM: 14.3 ug/mL (ref 10.0–20.0)

## 2016-10-09 NOTE — Telephone Encounter (Signed)
I spoke to patient and he is aware of results/.  

## 2016-10-09 NOTE — Telephone Encounter (Signed)
-----   Message from Star Age, MD sent at 10/09/2016  8:20 AM EDT ----- Dr. Jannifer Franklin patient: dilantin level in therapeutic range. FU with Dr. Jannifer Franklin or NP as planned.  Pls notify pt.  Star Age, MD, PhD [facility])

## 2016-10-09 NOTE — Progress Notes (Signed)
Dr. Jannifer Franklin patient: dilantin level in therapeutic range. FU with Dr. Jannifer Franklin or NP as planned.  Pls notify pt.  Tristan Age, MD, PhD [facility])

## 2016-10-10 ENCOUNTER — Other Ambulatory Visit: Payer: Self-pay

## 2016-10-10 DIAGNOSIS — R7611 Nonspecific reaction to tuberculin skin test without active tuberculosis: Secondary | ICD-10-CM | POA: Diagnosis not present

## 2016-10-14 LAB — SPECIMEN STATUS REPORT

## 2016-11-04 ENCOUNTER — Other Ambulatory Visit: Payer: Self-pay | Admitting: Internal Medicine

## 2016-11-04 DIAGNOSIS — I10 Essential (primary) hypertension: Secondary | ICD-10-CM

## 2016-11-10 DIAGNOSIS — R7611 Nonspecific reaction to tuberculin skin test without active tuberculosis: Secondary | ICD-10-CM | POA: Diagnosis not present

## 2016-11-25 ENCOUNTER — Other Ambulatory Visit: Payer: Medicare Other

## 2016-11-27 ENCOUNTER — Telehealth: Payer: Self-pay | Admitting: Neurology

## 2016-11-27 NOTE — Telephone Encounter (Signed)
Tristan [name]/[facility] [phone]  (f) [phone] request last dilantin to be faxed to her  Thank you

## 2016-11-27 NOTE — Telephone Encounter (Signed)
Results faxed as requested

## 2016-12-09 ENCOUNTER — Ambulatory Visit: Payer: Medicare Other | Admitting: Internal Medicine

## 2016-12-11 DIAGNOSIS — R7611 Nonspecific reaction to tuberculin skin test without active tuberculosis: Secondary | ICD-10-CM | POA: Diagnosis not present

## 2016-12-28 ENCOUNTER — Other Ambulatory Visit: Payer: Self-pay | Admitting: Internal Medicine

## 2016-12-28 DIAGNOSIS — B20 Human immunodeficiency virus [HIV] disease: Secondary | ICD-10-CM

## 2017-01-09 DIAGNOSIS — Z111 Encounter for screening for respiratory tuberculosis: Secondary | ICD-10-CM | POA: Diagnosis not present

## 2017-01-09 DIAGNOSIS — R7611 Nonspecific reaction to tuberculin skin test without active tuberculosis: Secondary | ICD-10-CM | POA: Diagnosis not present

## 2017-03-16 ENCOUNTER — Other Ambulatory Visit: Payer: Medicare Other

## 2017-03-30 ENCOUNTER — Other Ambulatory Visit: Payer: Self-pay | Admitting: Internal Medicine

## 2017-03-30 ENCOUNTER — Ambulatory Visit: Payer: Medicare Other | Admitting: Internal Medicine

## 2017-03-30 DIAGNOSIS — B20 Human immunodeficiency virus [HIV] disease: Secondary | ICD-10-CM

## 2017-03-30 NOTE — Telephone Encounter (Signed)
Patient needing f/u MD appt, transportation issues.  Scheduled appt for 04/07/17.  Lab and MD same day.

## 2017-04-07 ENCOUNTER — Ambulatory Visit: Payer: Medicare Other | Admitting: Internal Medicine

## 2017-04-07 ENCOUNTER — Ambulatory Visit: Payer: Medicare Other | Admitting: Adult Health

## 2017-04-16 ENCOUNTER — Encounter: Payer: Self-pay | Admitting: Adult Health

## 2017-04-16 ENCOUNTER — Encounter (INDEPENDENT_AMBULATORY_CARE_PROVIDER_SITE_OTHER): Payer: Self-pay

## 2017-04-16 ENCOUNTER — Telehealth: Payer: Self-pay | Admitting: Adult Health

## 2017-04-16 ENCOUNTER — Ambulatory Visit (INDEPENDENT_AMBULATORY_CARE_PROVIDER_SITE_OTHER): Payer: Medicare Other | Admitting: Adult Health

## 2017-04-16 VITALS — BP 114/76 | HR 74 | Resp 16 | Ht 75.0 in | Wt 167.5 lb

## 2017-04-16 DIAGNOSIS — R569 Unspecified convulsions: Secondary | ICD-10-CM

## 2017-04-16 DIAGNOSIS — Z5181 Encounter for therapeutic drug level monitoring: Secondary | ICD-10-CM | POA: Diagnosis not present

## 2017-04-16 MED ORDER — PHENYTOIN SODIUM EXTENDED 100 MG PO CAPS: 200.0000 mg | ORAL_CAPSULE | Freq: Two times a day (BID) | ORAL | 3 refills | Status: DC

## 2017-04-16 MED ORDER — LEVETIRACETAM 500 MG PO TABS: 500.0000 mg | ORAL_TABLET | Freq: Two times a day (BID) | ORAL | 3 refills | Status: DC
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
## 2017-04-16 NOTE — Patient Instructions (Signed)
Continue Dilantina and Jerome Blood work today If you have any seizure events please let us know.

## 2017-04-16 NOTE — Progress Notes (Signed)
I have read the note, and I agree with the clinical assessment and plan.  Tristan [name],Tristan [name]   

## 2017-04-17 LAB — COMPREHENSIVE METABOLIC PANEL
ALBUMIN: 4.7 g/dL (ref 3.5–5.5)
ALT: 21 IU/L (ref 0–44)
AST: 24 IU/L (ref 0–40)
Albumin/Globulin Ratio: 1.6 (ref 1.2–2.2)
Alkaline Phosphatase: 83 IU/L (ref 39–117)
BUN / CREAT RATIO: 14 (ref 9–20)
BUN: 13 mg/dL (ref 6–24)
Bilirubin Total: 0.3 mg/dL (ref 0.0–1.2)
CALCIUM: 9.4 mg/dL (ref 8.7–10.2)
CO2: 27 mmol/L (ref 18–29)
CREATININE: 0.93 mg/dL (ref 0.76–1.27)
Chloride: 101 mmol/L (ref 96–106)
GFR calc Af Amer: 107 mL/min/{1.73_m2} (ref >59)
GFR, EST NON AFRICAN AMERICAN: 93 mL/min/{1.73_m2} (ref >59)
GLOBULIN, TOTAL: 3 g/dL (ref 1.5–4.5)
Glucose: 70 mg/dL (ref 65–99)
Potassium: 3.5 mmol/L (ref 3.5–5.2)
SODIUM: 143 mmol/L (ref 134–144)
Total Protein: 7.7 g/dL (ref 6.0–8.5)

## 2017-04-17 LAB — PHENYTOIN LEVEL, TOTAL: Phenytoin (Dilantin), Serum: 12 ug/mL (ref 10.0–20.0)

## 2017-04-17 LAB — CBC WITH DIFFERENTIAL/PLATELET
BASOS: 0 %
Basophils Absolute: 0 10*3/uL (ref 0.0–0.2)
EOS (ABSOLUTE): 0 10*3/uL (ref 0.0–0.4)
EOS: 0 %
HEMATOCRIT: 40 % (ref 37.5–51.0)
Hemoglobin: 13.7 g/dL (ref 13.0–17.7)
Immature Grans (Abs): 0 10*3/uL (ref 0.0–0.1)
Immature Granulocytes: 0 %
LYMPHS ABS: 3.2 10*3/uL — AB (ref 0.7–3.1)
Lymphs: 33 %
MCH: 29.6 pg (ref 26.6–33.0)
MCHC: 34.3 g/dL (ref 31.5–35.7)
MCV: 86 fL (ref 79–97)
MONOS ABS: 0.6 10*3/uL (ref 0.1–0.9)
Monocytes: 6 %
Neutrophils Absolute: 5.7 10*3/uL (ref 1.4–7.0)
Neutrophils: 61 %
Platelets: 197 10*3/uL (ref 150–379)
RBC: 4.63 x10E6/uL (ref 4.14–5.80)
RDW: 14.8 % (ref 12.3–15.4)
WBC: 9.6 10*3/uL (ref 3.4–10.8)

## 2017-04-20 ENCOUNTER — Telehealth: Payer: Self-pay

## 2017-04-20 NOTE — Telephone Encounter (Signed)
I called pt and advised him that his lab work was unremarkable. Pt verbalized understanding of results. Pt had no questions at this time but was encouraged to call back if questions arise.

## 2017-04-20 NOTE — Telephone Encounter (Signed)
-----   Message from Ward Givens, NP sent at 04/20/2017  8:15 AM EDT ----- Lab work unremarkable. Please call patient.

## 2017-08-25 IMAGING — CR DG CHEST 1V
1 series · 1 of 1 positions shown · non-contrast
Comparison: Chest x-ray of August 17, 2009

CLINICAL DATA: Positive PPD.  No chest complaints

EXAM:
CHEST 1 VIEW

[w chest pa]
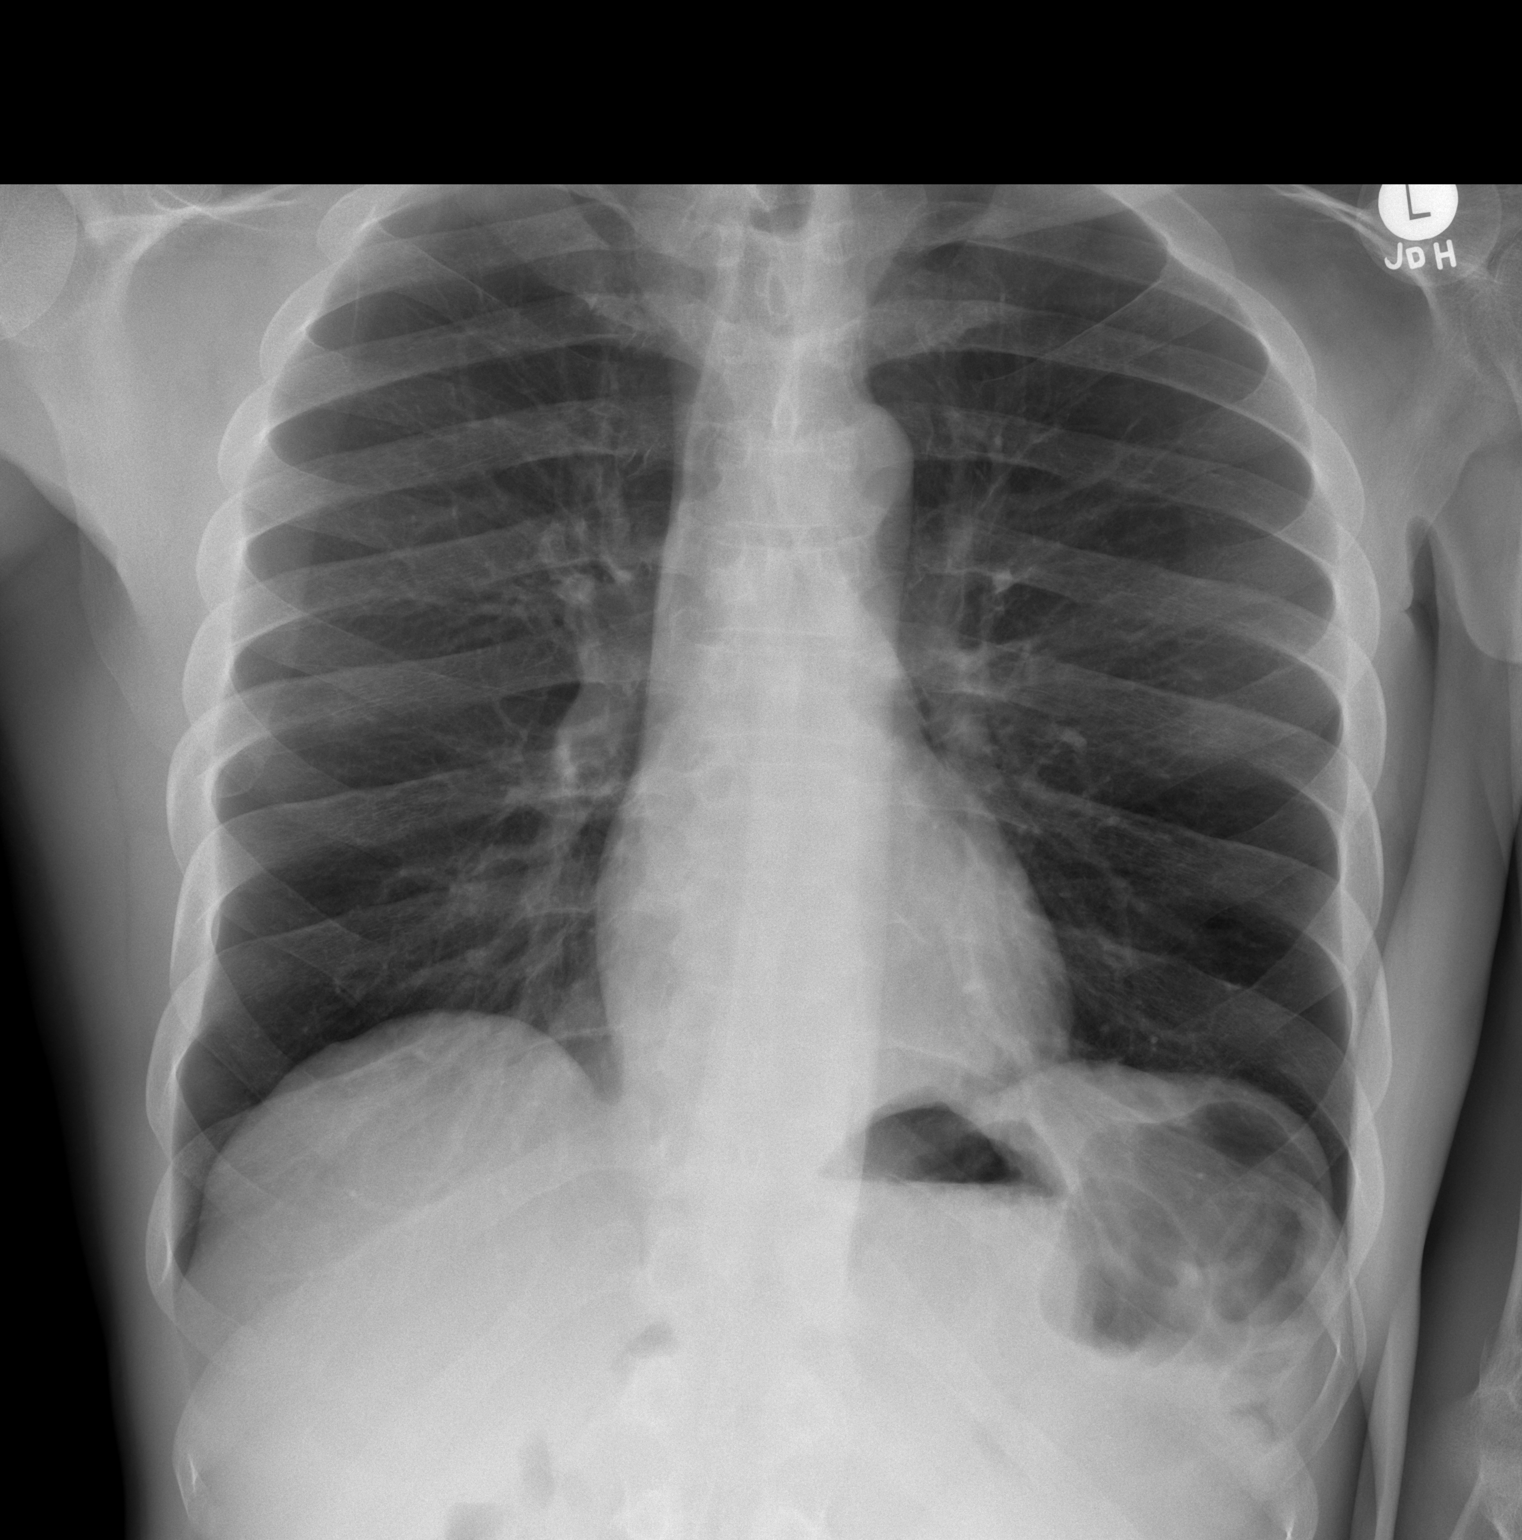

[1 of 1 positions shown; findings below may reference images not displayed]

FINDINGS: The lungs are well-expanded. There is no focal infiltrate. There is
no pleural effusion. The heart and pulmonary vascularity are normal.
The mediastinum is normal in width. The trachea is midline. There is
no pleural effusion.
IMPRESSION: There is no radiographic evidence of acute or old tuberculous
infection. There is no active cardiopulmonary disease.

## 2017-08-27 ENCOUNTER — Other Ambulatory Visit (HOSPITAL_COMMUNITY)
Admission: RE | Admit: 2017-08-27 | Discharge: 2017-08-27 | Disposition: A | Discharge status: Home / Self Care | Payer: Medicare Other | Source: Ambulatory Visit | Attending: Internal Medicine | Admitting: Internal Medicine

## 2017-08-27 ENCOUNTER — Other Ambulatory Visit: Payer: Medicare Other

## 2017-08-27 DIAGNOSIS — Z113 Encounter for screening for infections with a predominantly sexual mode of transmission: Secondary | ICD-10-CM

## 2017-08-27 DIAGNOSIS — Z79899 Other long term (current) drug therapy: Secondary | ICD-10-CM

## 2017-08-27 DIAGNOSIS — B20 Human immunodeficiency virus [HIV] disease: Secondary | ICD-10-CM | POA: Diagnosis not present

## 2017-08-28 LAB — COMPLETE METABOLIC PANEL WITH GFR
AG Ratio: 1.7 (calc) (ref 1.0–2.5)
ALKALINE PHOSPHATASE (APISO): 76 U/L (ref 40–115)
ALT: 17 U/L (ref 9–46)
AST: 20 U/L (ref 10–35)
Albumin: 4.4 g/dL (ref 3.6–5.1)
BUN: 10 mg/dL (ref 7–25)
CALCIUM: 9.2 mg/dL (ref 8.6–10.3)
CO2: 30 mmol/L (ref 20–32)
CREATININE: 0.87 mg/dL (ref 0.70–1.33)
Chloride: 101 mmol/L (ref 98–110)
GFR, EST NON AFRICAN AMERICAN: 98 mL/min/{1.73_m2} (ref >=60)
GFR, Est African American: 113 mL/min/{1.73_m2} (ref >=60)
GLOBULIN: 2.6 g/dL (ref 1.9–3.7)
GLUCOSE: 149 mg/dL — AB (ref 65–99)
Potassium: 3.7 mmol/L (ref 3.5–5.3)
Sodium: 138 mmol/L (ref 135–146)
Total Bilirubin: 0.2 mg/dL (ref 0.2–1.2)
Total Protein: 7 g/dL (ref 6.1–8.1)

## 2017-08-28 LAB — URINE CYTOLOGY ANCILLARY ONLY
Chlamydia: NEGATIVE
Neisseria Gonorrhea: NEGATIVE

## 2017-08-28 LAB — LIPID PANEL
CHOL/HDL RATIO: 3.4 (calc) (ref <5.0)
CHOLESTEROL: 190 mg/dL (ref <200)
HDL: 56 mg/dL (ref >40)
LDL CHOLESTEROL (CALC): 98 mg/dL
NON-HDL CHOLESTEROL (CALC): 134 mg/dL — AB (ref <130)
TRIGLYCERIDES: 236 mg/dL — AB (ref <150)

## 2017-08-28 LAB — CBC WITH DIFFERENTIAL/PLATELET
BASOS ABS: 39 {cells}/uL (ref 0–200)
Basophils Relative: 0.5 %
EOS PCT: 0.5 %
Eosinophils Absolute: 39 cells/uL (ref 15–500)
HEMATOCRIT: 36.7 % — AB (ref 38.5–50.0)
Hemoglobin: 12.6 g/dL — ABNORMAL LOW (ref 13.2–17.1)
LYMPHS ABS: 2749 {cells}/uL (ref 850–3900)
MCH: 29.3 pg (ref 27.0–33.0)
MCHC: 34.3 g/dL (ref 32.0–36.0)
MCV: 85.3 fL (ref 80.0–100.0)
MONOS PCT: 6.6 %
MPV: 11.6 fL (ref 7.5–12.5)
NEUTROS ABS: 4366 {cells}/uL (ref 1500–7800)
Neutrophils Relative %: 56.7 %
Platelets: 199 10*3/uL (ref 140–400)
RBC: 4.3 10*6/uL (ref 4.20–5.80)
RDW: 13.6 % (ref 11.0–15.0)
Total Lymphocyte: 35.7 %
WBC mixed population: 508 cells/uL (ref 200–950)
WBC: 7.7 10*3/uL (ref 3.8–10.8)

## 2017-08-28 LAB — T-HELPER CELL (CD4) - (RCID CLINIC ONLY)
CD4 T CELL ABS: 1300 /uL (ref 400–2700)
CD4 T CELL HELPER: 45 % (ref 33–55)

## 2017-08-28 LAB — RPR: RPR Ser Ql: NONREACTIVE

## 2017-08-31 LAB — HIV-1 RNA QUANT-NO REFLEX-BLD
HIV 1 RNA QUANT: 81 {copies}/mL — AB
HIV-1 RNA QUANT, LOG: 1.91 {Log_copies}/mL — AB

## 2017-09-10 ENCOUNTER — Ambulatory Visit (INDEPENDENT_AMBULATORY_CARE_PROVIDER_SITE_OTHER): Payer: Medicare Other | Admitting: Internal Medicine

## 2017-09-10 ENCOUNTER — Encounter: Payer: Self-pay | Admitting: Internal Medicine

## 2017-09-10 ENCOUNTER — Encounter: Payer: Self-pay | Admitting: Family Medicine

## 2017-09-10 DIAGNOSIS — G40109 Localization-related (focal) (partial) symptomatic epilepsy and epileptic syndromes with simple partial seizures, not intractable, without status epilepticus: Secondary | ICD-10-CM

## 2017-09-10 DIAGNOSIS — B20 Human immunodeficiency virus [HIV] disease: Secondary | ICD-10-CM | POA: Diagnosis not present

## 2017-09-10 DIAGNOSIS — Z227 Latent tuberculosis: Secondary | ICD-10-CM

## 2017-09-10 DIAGNOSIS — Z23 Encounter for immunization: Secondary | ICD-10-CM | POA: Diagnosis present

## 2017-09-10 DIAGNOSIS — R7611 Nonspecific reaction to tuberculin skin test without active tuberculosis: Secondary | ICD-10-CM

## 2017-09-10 NOTE — Assessment & Plan Note (Signed)
He successfully completed 9 months of INH for latent tuberculosis.

## 2017-09-10 NOTE — Progress Notes (Signed)
Patient Active Problem List   Diagnosis Date Noted  . Human immunodeficiency virus (HIV) disease ([place]) 02/05/2007    Priority: High  . Latent tuberculosis by blood test 06/05/2016    Priority: Medium  . 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 Gait disorder 04/05/2015  . HERPES ZOSTER, UNCOMPLICATED 26/37/8588  . HYPERLIPIDEMIA 02/05/2007  . ABUSE, ALCOHOL, UNSPECIFIED 02/05/2007  . DISORDER, TOBACCO USE 02/05/2007  . HEMIPARESIS 02/05/2007  . Essential hypertension 02/05/2007  . Convulsions ([place]) 02/05/2007    Patient's Medications  New Prescriptions   No medications on file  Previous Medications   ASPIRIN EC 81 MG TABLET    Take 81 mg by mouth daily.     HYDROCHLOROTHIAZIDE (HYDRODIURIL) 50 MG TABLET    TAKE ONE TABLET BY MOUTH ONCE DAILY   ISONIAZID (NYDRAZID) 300 MG TABLET    Take by mouth daily. X 9 mos for latent TB   LEVETIRACETAM (KEPPRA) 500 MG TABLET    Take 1 tablet (500 mg total) by mouth 2 (two) times daily.   PHENYTOIN (DILANTIN) 100 MG ER CAPSULE    Take 2 capsules (200 mg total) by mouth 2 (two) times daily. In the Morning and Evening   PYRIDOXINE (VITAMIN B-6) 25 MG TABLET    Take 25 mg by mouth daily. X 9 mos for latent TB   TIVICAY 50 MG TABLET    TAKE ONE TABLET BY MOUTH EVERY EVENING   TRIUMEQ 600-50-300 MG TABLET    TAKE ONE TABLET BY MOUTH EVERY MORNING  Modified Medications   No medications on file  Discontinued Medications   No medications on file    Subjective: Tristan [name] is in for his routine follow-up visit. He has had no problems taking her tolerating his Triumeq and Tivicay. He was out for 3 days on occasion when his pharmacy is late with a refill but otherwise has not missed any doses. He takes his Triumeq each morning with breakfast and his Tivicay in the evening. He has not had any seizures since his last visit. He has a hole in one of his right maxillary molars. It is not painful but he is worried that it could become infected. He  completed therapy with INH for latent tuberculosis in March.  Review of Systems: Review of Systems  Constitutional: Negative for chills, diaphoresis, fever, malaise/fatigue and weight loss.  HENT: Negative for sore throat.   Respiratory: Negative for cough, sputum production and shortness of breath.   Cardiovascular: Negative for chest pain.  Gastrointestinal: Negative for abdominal pain, diarrhea, heartburn, nausea and vomiting.  Genitourinary: Negative for dysuria and frequency.  Musculoskeletal: Negative for joint pain and myalgias.  Skin: Negative for rash.  Neurological: Positive for focal weakness. Negative for dizziness, seizures and headaches.  Psychiatric/Behavioral: Negative for depression and substance abuse. The patient is not nervous/anxious.     Past Medical History:  Diagnosis Date  . Abnormality of gait 04/04/2013  . ACHALASIA 02/05/2007   Annotation: Botox injection 2004 Qualifier: Diagnosis of  By: [name], Daine Croker    . ALLERGIC RHINITIS 02/05/2007   Qualifier: Diagnosis of  By: [name], Jyl Chico    . Chicken pox   . Dysphagia, oral phase 02/05/2007   Qualifier: Diagnosis of  By: [name], Livianna Petraglia    . 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 Gait disorder 04/05/2015  . GERD 09/28/2007   Qualifier: Diagnosis of  By: [name], Cirilo Canner    .  GYNECOMASTIA 02/05/2007   Annotation: transient, while on HART Qualifier: Diagnosis of  By: [name], Anita Laguna    . Head injury    10/1990  . HIP FRACTURE, LEFT 02/05/2007   Annotation: 1/04, s/p ORIF Qualifier: Diagnosis of  By: [name], Rhemi Balbach    . HIV infection ([place])   . Hypertension   . LAPAROSCOPIC MYOTOMY AND FUNDOPLICATION 3/38/2505   Annotation: 8/10 Qualifier: Diagnosis of  By: [name], Amneet Cendejas    . PNEUMONIA, HX OF 02/05/2007   Annotation: with pneumothorax 1991 Qualifier: Diagnosis of  By: [name], Derak Schurman    . Seasonal allergies   . Seizure ([place])   . Seizures ([place])     Social History  Substance Use  Topics  . Smoking status: Former Smoker    Packs/day: 0.50    Years: 23.00    Types: Cigarettes    Quit date: 06/21/1997  . Smokeless tobacco: Never Used  . Alcohol use No    Family History  Problem Relation Age of Onset  . Colon cancer Mother 71  . Diabetes Mother   . Hypertension Father   . Stroke Father   . Diabetes Sister   . Diabetes Sister     No Known Allergies  Objective:  Vitals:   09/10/17 1058  Weight: 165 lb (74.8 kg)  Height: 6\' 3"  (1.905 m)   Body mass index is 20.62 kg/m.  Physical Exam  Constitutional: He is oriented to person, place, and time.  He is in good spirits as usual.  HENT:  Mouth/Throat: No oropharyngeal exudate.  Eyes: Conjunctivae are normal.  Cardiovascular: Normal rate and regular rhythm.   No murmur heard. Pulmonary/Chest: Effort normal and breath sounds normal.  Abdominal: Soft. He exhibits no mass. There is no tenderness.  Musculoskeletal: Normal range of motion.  Neurological: He is alert and oriented to person, place, and time.  No change in his left hemiparesthesias.  Skin: No rash noted.  Psychiatric: Mood and affect normal.    Lab Results Lab Results  Component Value Date   WBC 7.7 08/27/2017   HGB 12.6 (L) 08/27/2017   HCT 36.7 (L) 08/27/2017   MCV 85.3 08/27/2017   PLT 199 08/27/2017    Lab Results  Component Value Date   CREATININE 0.87 08/27/2017   BUN 10 08/27/2017   NA 138 08/27/2017   K 3.7 08/27/2017   CL 101 08/27/2017   CO2 30 08/27/2017    Lab Results  Component Value Date   ALT 17 08/27/2017   AST 20 08/27/2017   ALKPHOS 83 04/16/2017   BILITOT 0.2 08/27/2017    Lab Results  Component Value Date   CHOL 190 08/27/2017   HDL 56 08/27/2017   LDLCALC NOT CALC 05/22/2016   TRIG 236 (H) 08/27/2017   CHOLHDL 3.4 08/27/2017   Lab Results  Component Value Date   LABRPR NON-REACTIVE 08/27/2017   HIV 1 RNA Quant (copies/mL)  Date Value  08/27/2017 81 (H)  05/22/2016 98 (H)  11/06/2015 49  (H)   CD4 T Cell Abs (/uL)  Date Value  08/27/2017 1,300  05/22/2016 1,370  11/06/2015 1,060     Problem List Items Addressed This Visit      High   Human immunodeficiency virus (HIV) disease ([place])    His viral load is just barely detectable but overall his infection is stable and well-controlled long-term. He will continue his current regimen and follow-up after lab work in one year.      Relevant  Orders   CBC   T-helper cell (CD4)- ([facility] clinic only)   Comprehensive metabolic panel   RPR   HIV 1 RNA quant-no reflex-bld     Medium   Latent tuberculosis by blood test    He successfully completed 9 months of INH for latent tuberculosis.        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           Michel Bickers, MD [facility] for [facility] [place] [facility] [phone] pager   [phone] cell 09/10/2017, 11:22 AM

## 2017-09-10 NOTE — Assessment & Plan Note (Signed)
His viral load is just barely detectable but overall his infection is stable and well-controlled long-term. He will continue his current regimen and follow-up after lab work in one year.

## 2017-09-10 NOTE — Assessment & Plan Note (Signed)
His seizures remain well controlled on Keppra and Dilantin.

## 2017-12-16 ENCOUNTER — Other Ambulatory Visit: Payer: Self-pay | Admitting: Internal Medicine

## 2017-12-16 DIAGNOSIS — I10 Essential (primary) hypertension: Secondary | ICD-10-CM

## 2017-12-31 ENCOUNTER — Encounter: Payer: Self-pay | Admitting: Family Medicine

## 2018-04-02 ENCOUNTER — Other Ambulatory Visit: Payer: Self-pay | Admitting: Internal Medicine

## 2018-04-02 DIAGNOSIS — B20 Human immunodeficiency virus [HIV] disease: Secondary | ICD-10-CM

## 2018-04-08 ENCOUNTER — Other Ambulatory Visit: Payer: Self-pay | Admitting: Adult Health

## 2018-04-19 ENCOUNTER — Ambulatory Visit (INDEPENDENT_AMBULATORY_CARE_PROVIDER_SITE_OTHER): Payer: Medicare Other | Admitting: Adult Health

## 2018-04-19 ENCOUNTER — Encounter: Payer: Self-pay | Admitting: Adult Health

## 2018-04-19 VITALS — BP 127/75 | HR 78 | Ht 75.0 in | Wt 166.0 lb

## 2018-04-19 DIAGNOSIS — Z5181 Encounter for therapeutic drug level monitoring: Secondary | ICD-10-CM

## 2018-04-19 DIAGNOSIS — R569 Unspecified convulsions: Secondary | ICD-10-CM | POA: Diagnosis not present

## 2018-04-19 DIAGNOSIS — G8194 Hemiplegia, unspecified affecting left nondominant side: Secondary | ICD-10-CM

## 2018-04-19 MED ORDER — LEVETIRACETAM 500 MG PO TABS: 500.0000 mg | ORAL_TABLET | Freq: Two times a day (BID) | ORAL | 3 refills | Status: DC

## 2018-04-19 MED ORDER — PHENYTOIN SODIUM EXTENDED 100 MG PO CAPS: ORAL_CAPSULE | ORAL | 3 refills | Status: DC

## 2018-04-19 NOTE — Patient Instructions (Signed)
Your Plan:  Continue Dilantin and Keppra Blood work today If your symptoms worsen or you develop new symptoms please let us know.   Thank you for coming to see us at [facility]. I hope we have been able to provide you high quality care today.  You may receive a patient satisfaction survey over the next few weeks. We would appreciate your feedback and comments so that we may continue to improve ourselves and the health of our patients.  

## 2018-04-19 NOTE — Progress Notes (Signed)
I have read the note, and I agree with the clinical assessment and plan.  Charles K Willis   
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
## 2018-04-20 LAB — COMPREHENSIVE METABOLIC PANEL
A/G RATIO: 1.6 (ref 1.2–2.2)
ALK PHOS: 104 IU/L (ref 39–117)
ALT: 18 IU/L (ref 0–44)
AST: 20 IU/L (ref 0–40)
Albumin: 4.5 g/dL (ref 3.5–5.5)
BUN/Creatinine Ratio: 13 (ref 9–20)
BUN: 12 mg/dL (ref 6–24)
CHLORIDE: 104 mmol/L (ref 96–106)
CO2: 26 mmol/L (ref 20–29)
Calcium: 9.2 mg/dL (ref 8.7–10.2)
Creatinine, Ser: 0.93 mg/dL (ref 0.76–1.27)
GFR calc Af Amer: 106 mL/min/{1.73_m2} (ref >59)
GFR calc non Af Amer: 92 mL/min/{1.73_m2} (ref >59)
Globulin, Total: 2.8 g/dL (ref 1.5–4.5)
Glucose: 71 mg/dL (ref 65–99)
POTASSIUM: 4.1 mmol/L (ref 3.5–5.2)
Sodium: 144 mmol/L (ref 134–144)
Total Protein: 7.3 g/dL (ref 6.0–8.5)

## 2018-04-20 LAB — CBC WITH DIFFERENTIAL/PLATELET
Basophils Absolute: 0 10*3/uL (ref 0.0–0.2)
Basos: 0 %
EOS (ABSOLUTE): 0 10*3/uL (ref 0.0–0.4)
Eos: 0 %
Hematocrit: 40 % (ref 37.5–51.0)
Hemoglobin: 13.4 g/dL (ref 13.0–17.7)
IMMATURE GRANULOCYTES: 0 %
Immature Grans (Abs): 0 10*3/uL (ref 0.0–0.1)
LYMPHS ABS: 2.9 10*3/uL (ref 0.7–3.1)
Lymphs: 50 %
MCH: 28.8 pg (ref 26.6–33.0)
MCHC: 33.5 g/dL (ref 31.5–35.7)
MCV: 86 fL (ref 79–97)
MONOS ABS: 0.4 10*3/uL (ref 0.1–0.9)
Monocytes: 7 %
NEUTROS PCT: 43 %
Neutrophils Absolute: 2.5 10*3/uL (ref 1.4–7.0)
PLATELETS: 188 10*3/uL (ref 150–379)
RBC: 4.65 x10E6/uL (ref 4.14–5.80)
RDW: 14.5 % (ref 12.3–15.4)
WBC: 6 10*3/uL (ref 3.4–10.8)

## 2018-04-20 LAB — PHENYTOIN LEVEL, TOTAL: Phenytoin (Dilantin), Serum: 14 ug/mL (ref 10.0–20.0)

## 2018-04-27 ENCOUNTER — Telehealth: Payer: Self-pay | Admitting: *Deleted

## 2018-04-27 NOTE — Telephone Encounter (Signed)
LVM requesting patient call back for lab results. If he returns call, phone staff may advised him his results are unremarkable.

## 2018-04-28 ENCOUNTER — Telehealth: Payer: Self-pay | Admitting: *Deleted

## 2018-04-28 NOTE — Telephone Encounter (Signed)
Spoke with patient and informed him his labs look good. He asked what his Dilantin level was; advised it is WNL, 14.0. Advised he continue taking medication as prescribed. He verbalzied understanding, appreciation.

## 2018-05-14 ENCOUNTER — Encounter: Payer: Self-pay | Admitting: Internal Medicine

## 2018-08-24 ENCOUNTER — Other Ambulatory Visit: Payer: Medicare Other

## 2018-08-25 ENCOUNTER — Other Ambulatory Visit: Payer: Medicare Other

## 2018-08-25 DIAGNOSIS — B20 Human immunodeficiency virus [HIV] disease: Secondary | ICD-10-CM | POA: Diagnosis not present

## 2018-08-26 LAB — T-HELPER CELL (CD4) - (RCID CLINIC ONLY)
CD4 T CELL ABS: 1790 /uL (ref 400–2700)
CD4 T CELL HELPER: 48 % (ref 33–55)

## 2018-08-27 LAB — CBC
HCT: 39.1 % (ref 38.5–50.0)
Hemoglobin: 13.1 g/dL — ABNORMAL LOW (ref 13.2–17.1)
MCH: 28.6 pg (ref 27.0–33.0)
MCHC: 33.5 g/dL (ref 32.0–36.0)
MCV: 85.4 fL (ref 80.0–100.0)
MPV: 12.2 fL (ref 7.5–12.5)
PLATELETS: 189 10*3/uL (ref 140–400)
RBC: 4.58 10*6/uL (ref 4.20–5.80)
RDW: 13.4 % (ref 11.0–15.0)
WBC: 7.6 10*3/uL (ref 3.8–10.8)

## 2018-08-27 LAB — COMPREHENSIVE METABOLIC PANEL WITH GFR
AG Ratio: 1.6 (calc) (ref 1.0–2.5)
ALT: 17 U/L (ref 9–46)
AST: 18 U/L (ref 10–35)
Albumin: 4.6 g/dL (ref 3.6–5.1)
Alkaline phosphatase (APISO): 94 U/L (ref 40–115)
BUN: 11 mg/dL (ref 7–25)
CO2: 31 mmol/L (ref 20–32)
Calcium: 9.5 mg/dL (ref 8.6–10.3)
Chloride: 103 mmol/L (ref 98–110)
Creat: 0.95 mg/dL (ref 0.70–1.33)
Globulin: 2.8 g/dL (ref 1.9–3.7)
Glucose, Bld: 77 mg/dL (ref 65–99)
Potassium: 4.1 mmol/L (ref 3.5–5.3)
Sodium: 139 mmol/L (ref 135–146)
Total Bilirubin: 0.2 mg/dL (ref 0.2–1.2)
Total Protein: 7.4 g/dL (ref 6.1–8.1)

## 2018-08-27 LAB — HIV-1 RNA QUANT-NO REFLEX-BLD
HIV 1 RNA Quant: 21 copies/mL — ABNORMAL HIGH
HIV-1 RNA QUANT, LOG: 1.32 {Log_copies}/mL — AB

## 2018-08-27 LAB — SYPHILIS: RPR W/REFLEX TO RPR TITER AND TREPONEMAL ANTIBODIES, TRADITIONAL SCREENING AND DIAGNOSIS ALGORITHM: RPR Ser Ql: NONREACTIVE

## 2018-09-07 ENCOUNTER — Ambulatory Visit: Payer: Medicare Other | Admitting: Internal Medicine

## 2018-09-08 ENCOUNTER — Encounter: Payer: Self-pay | Admitting: Internal Medicine

## 2018-09-08 ENCOUNTER — Ambulatory Visit (INDEPENDENT_AMBULATORY_CARE_PROVIDER_SITE_OTHER): Payer: Medicare Other | Admitting: Internal Medicine

## 2018-09-08 DIAGNOSIS — Z23 Encounter for immunization: Secondary | ICD-10-CM

## 2018-09-08 DIAGNOSIS — B20 Human immunodeficiency virus [HIV] disease: Secondary | ICD-10-CM

## 2018-09-08 NOTE — Progress Notes (Signed)
Patient Active Problem List   Diagnosis Date Noted  . Human immunodeficiency virus (HIV) disease ([place]) 02/05/2007    Priority: High  . Latent tuberculosis by blood test 06/05/2016    Priority: Medium  . 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 Gait disorder 04/05/2015  . HERPES ZOSTER, UNCOMPLICATED 17/00/1749  . HYPERLIPIDEMIA 02/05/2007  . ABUSE, ALCOHOL, UNSPECIFIED 02/05/2007  . DISORDER, TOBACCO USE 02/05/2007  . HEMIPARESIS 02/05/2007  . Essential hypertension 02/05/2007  . Convulsions ([place]) 02/05/2007    Patient's Medications  New Prescriptions   No medications on file  Previous Medications   ASPIRIN EC 81 MG TABLET    Take 81 mg by mouth daily.     HYDROCHLOROTHIAZIDE (HYDRODIURIL) 50 MG TABLET    TAKE ONE TABLET BY MOUTH ONCE DAILY   LEVETIRACETAM (KEPPRA) 500 MG TABLET    Take 1 tablet (500 mg total) by mouth 2 (two) times daily.   PHENYTOIN (DILANTIN) 100 MG ER CAPSULE    TAKE 2 CAPSULES BY MOUTH TWICE DAILY IN THE MORNING AND EVENING   TIVICAY 50 MG TABLET    TAKE 1 TABLET BY MOUTH EVERY EVENING   TRIUMEQ 600-50-300 MG TABLET    TAKE 1 TABLET BY MOUTH EVERY MORNING  Modified Medications   No medications on file  Discontinued Medications   No medications on file    Subjective: Tristan [name] is in for his routine follow-up visit.  He recalls missing 1 dose of Triumeq when his pharmacy was late filling it but otherwise he has had no problems obtaining, taking or tolerating Triumeq until okay.  He has not had any seizures over the past year.  He is having some discomfort of 2 teeth and would like this be seen back in the dental clinic.  Otherwise he is doing well.  Review of Systems: Review of Systems  Constitutional: Negative for chills, diaphoresis, fever, malaise/fatigue and weight loss.  HENT: Negative for sore throat.        Dental discomfort as noted in HPI.  Respiratory: Negative for cough, sputum production and shortness of breath.     Cardiovascular: Negative for chest pain.  Gastrointestinal: Negative for abdominal pain, diarrhea, heartburn, nausea and vomiting.  Genitourinary: Negative for dysuria and frequency.  Musculoskeletal: Negative for joint pain and myalgias.  Skin: Negative for rash.  Neurological: Positive for focal weakness. Negative for dizziness and headaches.  Psychiatric/Behavioral: Negative for depression and substance abuse. The patient is not nervous/anxious.     Past Medical History:  Diagnosis Date  . Abnormality of gait 04/04/2013  . ACHALASIA 02/05/2007   Annotation: Botox injection 2004 Qualifier: Diagnosis of  By: [name], Anali Cabanilla    . ALLERGIC RHINITIS 02/05/2007   Qualifier: Diagnosis of  By: [name], Timmie Dugue    . Chicken pox   . Dysphagia, oral phase 02/05/2007   Qualifier: Diagnosis of  By: [name], Elon Eoff    . 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 Gait disorder 04/05/2015  . GERD 09/28/2007   Qualifier: Diagnosis of  By: [name], Malaak Stach    . GYNECOMASTIA 02/05/2007   Annotation: transient, while on HART Qualifier: Diagnosis of  By: [name], Shemica Meath    . Head injury    10/1990  . HIP FRACTURE, LEFT 02/05/2007   Annotation: 1/04, s/p ORIF Qualifier: Diagnosis of  By: [name], Mykell Genao    . HIV infection ([place])   . Hypertension   . LAPAROSCOPIC MYOTOMY  AND FUNDOPLICATION 6/57/8469   Annotation: 8/10 Qualifier: Diagnosis of  By: [name], Fredrika Canby    . PNEUMONIA, HX OF 02/05/2007   Annotation: with pneumothorax 1991 Qualifier: Diagnosis of  By: [name], Consuelo Thayne    . Seasonal allergies   . Seizure ([place])   . Seizures ([place])     Social History   Tobacco Use  . Smoking status: Former Smoker    Packs/day: 0.50    Years: 23.00    Pack years: 11.50    Types: Cigarettes    Last attempt to quit: 06/21/1997    Years since quitting: 21.2  . Smokeless tobacco: Never Used  Substance Use Topics  . Alcohol use: No    Alcohol/week: 0.0 standard drinks  . Drug use: Yes     Frequency: 4.0 times per week    Types: Marijuana    Comment: regular basis-3 TIMES A WEEK    Family History  Problem Relation Age of Onset  . Colon cancer Mother 84  . Diabetes Mother   . Hypertension Father   . Stroke Father   . Diabetes Sister   . Diabetes Sister     No Known Allergies  Health Maintenance  Topic Date Due  . TETANUS/TDAP  10/19/1981  . COLONOSCOPY  07/05/2018  . INFLUENZA VACCINE  07/22/2018  . Hepatitis C Screening  Completed  . HIV Screening  Completed    Objective:  Vitals:   09/08/18 1028  BP: (!) 143/81  Pulse: 66  Temp: 97.9 F (36.6 C)  TempSrc: Oral  Weight: 170 lb (77.1 kg)  Height: 6\' 3"  (1.905 m)   Body mass index is 21.25 kg/m.  Physical Exam  Constitutional: He is oriented to person, place, and time.  He is in good spirits.  He has is [employer] on.  HENT:  Mouth/Throat: No oropharyngeal exudate.  Eyes: Conjunctivae are normal.  Cardiovascular: Normal rate, regular rhythm and normal heart sounds.  No murmur heard. Pulmonary/Chest: Effort normal and breath sounds normal.  Abdominal: Soft. He exhibits no mass. There is no tenderness.  Musculoskeletal: Normal range of motion.  Neurological: He is alert and oriented to person, place, and time.  Chronic left-sided weakness.  Skin: No rash noted.    Lab Results Lab Results  Component Value Date   WBC 7.6 08/25/2018   HGB 13.1 (L) 08/25/2018   HCT 39.1 08/25/2018   MCV 85.4 08/25/2018   PLT 189 08/25/2018    Lab Results  Component Value Date   CREATININE 0.95 08/25/2018   BUN 11 08/25/2018   NA 139 08/25/2018   K 4.1 08/25/2018   CL 103 08/25/2018   CO2 31 08/25/2018    Lab Results  Component Value Date   ALT 17 08/25/2018   AST 18 08/25/2018   ALKPHOS 104 04/19/2018   BILITOT 0.2 08/25/2018    Lab Results  Component Value Date   CHOL 190 08/27/2017   HDL 56 08/27/2017   LDLCALC 98 08/27/2017   TRIG 236 (H) 08/27/2017   CHOLHDL 3.4 08/27/2017    Lab Results  Component Value Date   LABRPR NON-REACTIVE 08/25/2018   HIV 1 RNA Quant (copies/mL)  Date Value  08/25/2018 21 (H)  08/27/2017 81 (H)  05/22/2016 98 (H)   CD4 T Cell Abs (/uL)  Date Value  08/25/2018 1,790  08/27/2017 1,300  05/22/2016 1,370     Problem List Items Addressed This Visit      High   Human immunodeficiency virus (HIV)  disease ([place])    His infection is under very good long-term control.  He received his influenza vaccination today.  He will continue Triumeq and Tivicay and follow-up after lab work in 1 year.  We will arrange for him to be seen in our dental clinic.      Relevant Orders   CBC   T-helper cell (CD4)- ([facility] clinic only)   Comprehensive metabolic panel   RPR   HIV-1 RNA quant-no reflex-bld        Michel Bickers, MD [facility] for [place] [phone] pager   [phone] cell 09/08/2018, 10:43 AM

## 2018-09-08 NOTE — Assessment & Plan Note (Signed)
His infection is under very good long-term control.  He received his influenza vaccination today.  He will continue Triumeq and Tivicay and follow-up after lab work in 1 year.  We will arrange for him to be seen in our dental clinic.

## 2019-03-27 ENCOUNTER — Other Ambulatory Visit: Payer: Self-pay | Admitting: Internal Medicine

## 2019-03-27 DIAGNOSIS — B20 Human immunodeficiency virus [HIV] disease: Secondary | ICD-10-CM

## 2019-04-06 ENCOUNTER — Telehealth: Payer: Self-pay | Admitting: Neurology

## 2019-04-06 NOTE — Telephone Encounter (Signed)
This is a Willis/Megan NP patient who needs to be rescheduled due to MM being out. Patient can be called and converted to video visit with [name] NP.

## 2019-04-07 NOTE — Telephone Encounter (Signed)
Spoke to patient he is scheduled for a virtual visit with [name] 04/11/2019

## 2019-04-11 ENCOUNTER — Other Ambulatory Visit: Payer: Self-pay

## 2019-04-11 ENCOUNTER — Ambulatory Visit: Payer: Medicare Other | Admitting: Neurology
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
## 2019-04-14 ENCOUNTER — Encounter: Payer: Self-pay | Admitting: Neurology

## 2019-04-14 ENCOUNTER — Ambulatory Visit (INDEPENDENT_AMBULATORY_CARE_PROVIDER_SITE_OTHER): Payer: Medicare Other | Admitting: Neurology

## 2019-04-14 ENCOUNTER — Other Ambulatory Visit: Payer: Self-pay

## 2019-04-14 DIAGNOSIS — G40109 Localization-related (focal) (partial) symptomatic epilepsy and epileptic syndromes with simple partial seizures, not intractable, without status epilepticus: Secondary | ICD-10-CM | POA: Diagnosis not present

## 2019-04-14 NOTE — Progress Notes (Signed)
I have read the note, and I agree with the clinical assessment and plan.  Navada Osterhout K Dollie Bressi   
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
## 2019-04-20 ENCOUNTER — Other Ambulatory Visit: Payer: Self-pay | Admitting: Internal Medicine

## 2019-04-20 DIAGNOSIS — I10 Essential (primary) hypertension: Secondary | ICD-10-CM

## 2019-04-25 ENCOUNTER — Ambulatory Visit: Payer: Medicare Other | Admitting: Adult Health

## 2019-05-04 ENCOUNTER — Other Ambulatory Visit: Payer: Self-pay

## 2019-05-04 ENCOUNTER — Other Ambulatory Visit (INDEPENDENT_AMBULATORY_CARE_PROVIDER_SITE_OTHER): Payer: Self-pay

## 2019-05-04 DIAGNOSIS — G40109 Localization-related (focal) (partial) symptomatic epilepsy and epileptic syndromes with simple partial seizures, not intractable, without status epilepticus: Secondary | ICD-10-CM

## 2019-05-04 DIAGNOSIS — Z0289 Encounter for other administrative examinations: Secondary | ICD-10-CM

## 2019-05-05 ENCOUNTER — Telehealth: Payer: Self-pay | Admitting: Neurology

## 2019-05-05 LAB — COMPREHENSIVE METABOLIC PANEL
ALT: 18 IU/L (ref 0–44)
AST: 24 IU/L (ref 0–40)
Albumin/Globulin Ratio: 1.8 (ref 1.2–2.2)
Albumin: 4.6 g/dL (ref 3.8–4.9)
Alkaline Phosphatase: 110 IU/L (ref 39–117)
BUN/Creatinine Ratio: 10 (ref 9–20)
BUN: 9 mg/dL (ref 6–24)
Bilirubin Total: <0.2 mg/dL (ref 0.0–1.2)
CO2: 25 mmol/L (ref 20–29)
Calcium: 9 mg/dL (ref 8.7–10.2)
Chloride: 103 mmol/L (ref 96–106)
Creatinine, Ser: 0.94 mg/dL (ref 0.76–1.27)
GFR calc Af Amer: 104 mL/min/{1.73_m2} (ref >59)
GFR calc non Af Amer: 90 mL/min/{1.73_m2} (ref >59)
Globulin, Total: 2.6 g/dL (ref 1.5–4.5)
Glucose: 150 mg/dL — ABNORMAL HIGH (ref 65–99)
Potassium: 3.8 mmol/L (ref 3.5–5.2)
Sodium: 143 mmol/L (ref 134–144)
Total Protein: 7.2 g/dL (ref 6.0–8.5)

## 2019-05-05 LAB — CBC WITH DIFFERENTIAL/PLATELET
Basophils Absolute: 0 10*3/uL (ref 0.0–0.2)
Basos: 1 %
EOS (ABSOLUTE): 0 10*3/uL (ref 0.0–0.4)
Eos: 0 %
Hematocrit: 37.5 % (ref 37.5–51.0)
Hemoglobin: 13.1 g/dL (ref 13.0–17.7)
Immature Grans (Abs): 0 10*3/uL (ref 0.0–0.1)
Immature Granulocytes: 0 %
Lymphocytes Absolute: 2.9 10*3/uL (ref 0.7–3.1)
Lymphs: 51 %
MCH: 29.7 pg (ref 26.6–33.0)
MCHC: 34.9 g/dL (ref 31.5–35.7)
MCV: 85 fL (ref 79–97)
Monocytes Absolute: 0.4 10*3/uL (ref 0.1–0.9)
Monocytes: 7 %
Neutrophils Absolute: 2.3 10*3/uL (ref 1.4–7.0)
Neutrophils: 41 %
Platelets: 210 10*3/uL (ref 150–450)
RBC: 4.41 x10E6/uL (ref 4.14–5.80)
RDW: 13 % (ref 11.6–15.4)
WBC: 5.7 10*3/uL (ref 3.4–10.8)

## 2019-05-05 LAB — PHENYTOIN LEVEL, TOTAL: Phenytoin (Dilantin), Serum: 13.1 ug/mL (ref 10.0–20.0)

## 2019-05-05 MED ORDER — LEVETIRACETAM 500 MG PO TABS: 500.0000 mg | ORAL_TABLET | Freq: Two times a day (BID) | ORAL | 3 refills | Status: DC

## 2019-05-05 MED ORDER — PHENYTOIN SODIUM EXTENDED 100 MG PO CAPS: ORAL_CAPSULE | ORAL | 3 refills | Status: DC

## 2019-05-05 NOTE — Telephone Encounter (Signed)
I called the patient.  Lab work was unremarkable with the exception of elevated glucose at 150.  Dilantin level was within good range at 13.1.  I will send in refills of his Dilantin and Keppra.

## 2019-08-30 ENCOUNTER — Other Ambulatory Visit: Payer: Medicare Other

## 2019-08-30 ENCOUNTER — Other Ambulatory Visit: Payer: Self-pay

## 2019-08-30 DIAGNOSIS — B20 Human immunodeficiency virus [HIV] disease: Secondary | ICD-10-CM | POA: Diagnosis not present

## 2019-08-31 LAB — T-HELPER CELL (CD4) - (RCID CLINIC ONLY)
CD4 % Helper T Cell: 47 % (ref 33–65)
CD4 T Cell Abs: 1441 /uL (ref 400–1790)

## 2019-09-01 LAB — COMPREHENSIVE METABOLIC PANEL
AG Ratio: 1.5 (calc) (ref 1.0–2.5)
ALT: 20 U/L (ref 9–46)
AST: 19 U/L (ref 10–35)
Albumin: 4.2 g/dL (ref 3.6–5.1)
Alkaline phosphatase (APISO): 97 U/L (ref 35–144)
BUN: 10 mg/dL (ref 7–25)
CO2: 26 mmol/L (ref 20–32)
Calcium: 9 mg/dL (ref 8.6–10.3)
Chloride: 105 mmol/L (ref 98–110)
Creat: 0.94 mg/dL (ref 0.70–1.33)
Globulin: 2.8 g/dL (calc) (ref 1.9–3.7)
Glucose, Bld: 101 mg/dL — ABNORMAL HIGH (ref 65–99)
Potassium: 3.5 mmol/L (ref 3.5–5.3)
Sodium: 141 mmol/L (ref 135–146)
Total Bilirubin: 0.3 mg/dL (ref 0.2–1.2)
Total Protein: 7 g/dL (ref 6.1–8.1)

## 2019-09-01 LAB — CBC
HCT: 38.8 % (ref 38.5–50.0)
Hemoglobin: 12.9 g/dL — ABNORMAL LOW (ref 13.2–17.1)
MCH: 28.4 pg (ref 27.0–33.0)
MCHC: 33.2 g/dL (ref 32.0–36.0)
MCV: 85.3 fL (ref 80.0–100.0)
MPV: 11.5 fL (ref 7.5–12.5)
Platelets: 228 10*3/uL (ref 140–400)
RBC: 4.55 10*6/uL (ref 4.20–5.80)
RDW: 13.3 % (ref 11.0–15.0)
WBC: 6.7 10*3/uL (ref 3.8–10.8)

## 2019-09-01 LAB — RPR: RPR Ser Ql: NONREACTIVE

## 2019-09-01 LAB — HIV-1 RNA QUANT-NO REFLEX-BLD
HIV 1 RNA Quant: <20 copies/mL — AB
HIV-1 RNA Quant, Log: <1.3 Log copies/mL — AB

## 2019-09-05 ENCOUNTER — Other Ambulatory Visit: Payer: Self-pay | Admitting: Internal Medicine

## 2019-09-05 DIAGNOSIS — I10 Essential (primary) hypertension: Secondary | ICD-10-CM

## 2019-09-12 ENCOUNTER — Ambulatory Visit (INDEPENDENT_AMBULATORY_CARE_PROVIDER_SITE_OTHER): Payer: Medicare Other | Admitting: Internal Medicine

## 2019-09-12 ENCOUNTER — Other Ambulatory Visit: Payer: Self-pay

## 2019-09-12 ENCOUNTER — Encounter: Payer: Self-pay | Admitting: Internal Medicine

## 2019-09-12 VITALS — BP 169/87 | HR 79 | Temp 98.1°F

## 2019-09-12 DIAGNOSIS — B20 Human immunodeficiency virus [HIV] disease: Secondary | ICD-10-CM

## 2019-09-12 DIAGNOSIS — Z23 Encounter for immunization: Secondary | ICD-10-CM | POA: Diagnosis not present

## 2019-09-12 NOTE — Assessment & Plan Note (Signed)
His infection remains under excellent, long-term control.  He will continue Triumeq and follow-up after lab work in 1 year.

## 2019-09-12 NOTE — Progress Notes (Signed)
Patient Active Problem List   Diagnosis Date Noted  . Human immunodeficiency virus (HIV) disease ([place]) 02/05/2007    Priority: High  . Latent tuberculosis by blood test 06/05/2016    Priority: Medium  . 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 Gait disorder 04/05/2015  . HERPES ZOSTER, UNCOMPLICATED [id number]  . HYPERLIPIDEMIA 02/05/2007  . ABUSE, ALCOHOL, UNSPECIFIED 02/05/2007  . DISORDER, TOBACCO USE 02/05/2007  . HEMIPARESIS 02/05/2007  . Essential hypertension 02/05/2007  . Convulsions ([place]) 02/05/2007    Patient's Medications  New Prescriptions   No medications on file  Previous Medications   ASPIRIN EC 81 MG TABLET    Take 81 mg by mouth daily.     HYDROCHLOROTHIAZIDE (HYDRODIURIL) 50 MG TABLET    Take 1 tablet by mouth once daily   LEVETIRACETAM (KEPPRA) 500 MG TABLET    Take 1 tablet (500 mg total) by mouth 2 (two) times daily.   PHENYTOIN (DILANTIN) 100 MG ER CAPSULE    TAKE 2 CAPSULES BY MOUTH TWICE DAILY IN THE MORNING AND EVENING   TIVICAY 50 MG TABLET    TAKE 1 TABLET BY MOUTH EVERY EVENING   TRIUMEQ 600-50-300 MG TABLET    TAKE 1 TABLET BY MOUTH EVERY MORNING  Modified Medications   No medications on file  Discontinued Medications   No medications on file    Subjective: Tristan [name] is in for his routine HIV follow-up visit.  He has had no problems obtaining, taking or tolerating his Triumeq and does not recall missing a single dose over the past year.  He has already received his influenza vaccine.  Review of Systems: Review of Systems  Constitutional: Negative for fever.  Respiratory: Negative for cough, sputum production and shortness of breath.   Cardiovascular: Negative for chest pain.  Psychiatric/Behavioral: Negative for depression.    Past Medical History:  Diagnosis Date  . Abnormality of gait 04/04/2013  . ACHALASIA 02/05/2007   Annotation: Botox injection 2004 Qualifier: Diagnosis of  By: [name], Damonte Frieson    . ALLERGIC RHINITIS  02/05/2007   Qualifier: Diagnosis of  By: [name], Lisaann Atha    . Chicken pox   . Dysphagia, oral phase 02/05/2007   Qualifier: Diagnosis of  By: [name], Athea Haley    . 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 Gait disorder 04/05/2015  . GERD 09/28/2007   Qualifier: Diagnosis of  By: [name], Amonte Brookover    . GYNECOMASTIA 02/05/2007   Annotation: transient, while on HART Qualifier: Diagnosis of  By: [name], Patria Warzecha    . Head injury    10/1990  . HIP FRACTURE, LEFT 02/05/2007   Annotation: 1/04, s/p ORIF Qualifier: Diagnosis of  By: [name], Lillis Nuttle    . HIV infection ([place])   . Hypertension   . LAPAROSCOPIC MYOTOMY AND FUNDOPLICATION [id number]   Annotation: 8/10 Qualifier: Diagnosis of  By: [name], Demoni Gergen    . PNEUMONIA, HX OF 02/05/2007   Annotation: with pneumothorax 1991 Qualifier: Diagnosis of  By: [name], Stevana Dufner    . Seasonal allergies   . Seizure ([place])   . Seizures ([place])     Social History   Tobacco Use  . Smoking status: Former Smoker    Packs/day: 0.50    Years: 23.00    Pack years: 11.50    Types: Cigarettes    Quit date: 06/21/1997    Years since quitting: 22.2  . Smokeless tobacco: Never Used  Substance Use Topics  . Alcohol use: No    Alcohol/week: 0.0 standard drinks  . Drug use: Yes    Frequency: 4.0 times per week    Types: Marijuana    Comment: regular basis-3 TIMES A WEEK    Family History  Problem Relation Age of Onset  . Colon cancer Mother 45  . Diabetes Mother   . Hypertension Father   . Stroke Father   . Diabetes Sister   . Diabetes Sister     No Known Allergies  Health Maintenance  Topic Date Due  . TETANUS/TDAP  10/19/1981  . COLONOSCOPY  07/05/2018  . INFLUENZA VACCINE  07/23/2019  . Hepatitis C Screening  Completed  . HIV Screening  Completed    Objective:  Vitals:   09/12/19 1043  BP: (!) 169/87  Pulse: 79  Temp: 98.1 F (36.7 C)  TempSrc: Oral  SpO2: 100%   There is no height or weight on file to calculate  BMI.  Physical Exam Constitutional:      Comments: He is in good spirits as usual.  Cardiovascular:     Rate and Rhythm: Normal rate and regular rhythm.     Heart sounds: No murmur.  Pulmonary:     Effort: Pulmonary effort is normal.     Breath sounds: Normal breath sounds.  Chest:     Chest wall: No tenderness.  Psychiatric:        Mood and Affect: Mood normal.     Lab Results Lab Results  Component Value Date   WBC 6.7 08/30/2019   HGB 12.9 (L) 08/30/2019   HCT 38.8 08/30/2019   MCV 85.3 08/30/2019   PLT 228 08/30/2019    Lab Results  Component Value Date   CREATININE 0.94 08/30/2019   BUN 10 08/30/2019   NA 141 08/30/2019   K 3.5 08/30/2019   CL 105 08/30/2019   CO2 26 08/30/2019    Lab Results  Component Value Date   ALT 20 08/30/2019   AST 19 08/30/2019   ALKPHOS 110 05/04/2019   BILITOT 0.3 08/30/2019    Lab Results  Component Value Date   CHOL 190 08/27/2017   HDL 56 08/27/2017   LDLCALC 98 08/27/2017   TRIG 236 (H) 08/27/2017   CHOLHDL 3.4 08/27/2017   Lab Results  Component Value Date   LABRPR NON-REACTIVE 08/30/2019   HIV 1 RNA Quant (copies/mL)  Date Value  08/30/2019 <20 DETECTED (A)  08/25/2018 21 (H)  08/27/2017 81 (H)   CD4 T Cell Abs (/uL)  Date Value  08/30/2019 1,441  08/25/2018 1,790  08/27/2017 1,300     Problem List Items Addressed This Visit      High   Human immunodeficiency virus (HIV) disease ([place])    His infection remains under excellent, long-term control.  He will continue Triumeq and follow-up after lab work in 1 year.      Relevant Orders   CBC   T-helper cell (CD4)- ([facility] clinic only)   Comprehensive metabolic panel   RPR   HIV-1 RNA quant-no reflex-bld        Michel Bickers, MD [facility] [place] [phone] pager   [phone] cell 09/12/2019, 10:55 AM

## 2019-09-16 NOTE — Addendum Note (Signed)
Addended by: Landis Gandy on: 09/16/2019 05:14 PM   Modules accepted: Orders

## 2019-09-25 ENCOUNTER — Other Ambulatory Visit: Payer: Self-pay | Admitting: Internal Medicine

## 2019-09-25 DIAGNOSIS — B20 Human immunodeficiency virus [HIV] disease: Secondary | ICD-10-CM
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
## 2019-09-27 ENCOUNTER — Other Ambulatory Visit: Payer: Self-pay

## 2019-09-27 ENCOUNTER — Encounter: Payer: Self-pay | Admitting: Neurology

## 2019-09-27 ENCOUNTER — Ambulatory Visit (INDEPENDENT_AMBULATORY_CARE_PROVIDER_SITE_OTHER): Payer: Medicare Other | Admitting: Neurology

## 2019-09-27 VITALS — BP 139/78 | HR 70 | Temp 97.7°F | Ht 75.0 in | Wt 168.0 lb

## 2019-09-27 DIAGNOSIS — G40109 Localization-related (focal) (partial) symptomatic epilepsy and epileptic syndromes with simple partial seizures, not intractable, without status epilepticus: Secondary | ICD-10-CM

## 2019-09-27 NOTE — Progress Notes (Signed)
I have read the note, and I agree with the clinical assessment and plan.  Tristan [name]   

## 2019-09-27 NOTE — Patient Instructions (Signed)
1. Continue current medications  2. Return in 8 months

## 2019-10-13 ENCOUNTER — Ambulatory Visit: Payer: Self-pay | Admitting: Neurology

## 2019-12-11 ENCOUNTER — Other Ambulatory Visit: Payer: Self-pay | Admitting: Internal Medicine

## 2019-12-11 DIAGNOSIS — I10 Essential (primary) hypertension: Secondary | ICD-10-CM

## 2020-03-18 ENCOUNTER — Other Ambulatory Visit: Payer: Self-pay | Admitting: Internal Medicine

## 2020-03-18 DIAGNOSIS — I10 Essential (primary) hypertension: Secondary | ICD-10-CM

## 2020-03-22 DIAGNOSIS — Z23 Encounter for immunization: Secondary | ICD-10-CM | POA: Diagnosis not present

## 2020-04-03 ENCOUNTER — Other Ambulatory Visit: Payer: Self-pay | Admitting: Internal Medicine

## 2020-04-03 DIAGNOSIS — B20 Human immunodeficiency virus [HIV] disease: Secondary | ICD-10-CM

## 2020-04-12 DIAGNOSIS — Z23 Encounter for immunization: Secondary | ICD-10-CM | POA: Diagnosis not present

## 2020-05-29 ENCOUNTER — Ambulatory Visit (INDEPENDENT_AMBULATORY_CARE_PROVIDER_SITE_OTHER): Payer: Medicare Other | Admitting: Neurology

## 2020-05-29 ENCOUNTER — Encounter: Payer: Self-pay | Admitting: Neurology

## 2020-05-29 VITALS — BP 154/86 | HR 76 | Ht 75.0 in | Wt 167.0 lb

## 2020-05-29 DIAGNOSIS — G40109 Localization-related (focal) (partial) symptomatic epilepsy and epileptic syndromes with simple partial seizures, not intractable, without status epilepticus: Secondary | ICD-10-CM | POA: Diagnosis not present

## 2020-05-29 MED ORDER — LEVETIRACETAM 500 MG PO TABS: 500.0000 mg | ORAL_TABLET | Freq: Two times a day (BID) | ORAL | 3 refills | Status: DC

## 2020-05-29 MED ORDER — PHENYTOIN SODIUM EXTENDED 100 MG PO CAPS: ORAL_CAPSULE | ORAL | 4 refills | Status: DC

## 2020-05-29 NOTE — Progress Notes (Signed)
I have read the note, and I agree with the clinical assessment and plan.  Louvenia Golomb K Zerline Melchior   
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
## 2020-05-29 NOTE — Patient Instructions (Signed)
Continue current medications Check blood work today Call for seizures See you in 1 year

## 2020-05-30 LAB — CBC WITH DIFFERENTIAL/PLATELET
Basophils Absolute: 0 10*3/uL (ref 0.0–0.2)
Basos: 0 %
EOS (ABSOLUTE): 0 10*3/uL (ref 0.0–0.4)
Eos: 0 %
Hematocrit: 40.8 % (ref 37.5–51.0)
Hemoglobin: 14.2 g/dL (ref 13.0–17.7)
Immature Grans (Abs): 0 10*3/uL (ref 0.0–0.1)
Immature Granulocytes: 0 %
Lymphocytes Absolute: 3.3 10*3/uL — ABNORMAL HIGH (ref 0.7–3.1)
Lymphs: 49 %
MCH: 30 pg (ref 26.6–33.0)
MCHC: 34.8 g/dL (ref 31.5–35.7)
MCV: 86 fL (ref 79–97)
Monocytes Absolute: 0.5 10*3/uL (ref 0.1–0.9)
Monocytes: 7 %
Neutrophils Absolute: 3 10*3/uL (ref 1.4–7.0)
Neutrophils: 44 %
Platelets: 204 10*3/uL (ref 150–450)
RBC: 4.73 x10E6/uL (ref 4.14–5.80)
RDW: 12.6 % (ref 11.6–15.4)
WBC: 6.8 10*3/uL (ref 3.4–10.8)

## 2020-05-30 LAB — COMPREHENSIVE METABOLIC PANEL
ALT: 24 IU/L (ref 0–44)
AST: 23 IU/L (ref 0–40)
Albumin/Globulin Ratio: 1.6 (ref 1.2–2.2)
Albumin: 4.6 g/dL (ref 3.8–4.9)
Alkaline Phosphatase: 112 IU/L (ref 48–121)
BUN/Creatinine Ratio: 13 (ref 9–20)
BUN: 12 mg/dL (ref 6–24)
Bilirubin Total: <0.2 mg/dL (ref 0.0–1.2)
CO2: 27 mmol/L (ref 20–29)
Calcium: 9.5 mg/dL (ref 8.7–10.2)
Chloride: 101 mmol/L (ref 96–106)
Creatinine, Ser: 0.91 mg/dL (ref 0.76–1.27)
GFR calc Af Amer: 108 mL/min/{1.73_m2} (ref >59)
GFR calc non Af Amer: 93 mL/min/{1.73_m2} (ref >59)
Globulin, Total: 2.8 g/dL (ref 1.5–4.5)
Glucose: 120 mg/dL — ABNORMAL HIGH (ref 65–99)
Potassium: 3.7 mmol/L (ref 3.5–5.2)
Sodium: 142 mmol/L (ref 134–144)
Total Protein: 7.4 g/dL (ref 6.0–8.5)

## 2020-05-30 LAB — PHENYTOIN LEVEL, TOTAL: Phenytoin (Dilantin), Serum: 14.2 ug/mL (ref 10.0–20.0)

## 2020-05-31 ENCOUNTER — Telehealth: Payer: Self-pay

## 2020-05-31 NOTE — Telephone Encounter (Signed)
Pt verified by name and DOB, results given per provider, pt voiced understanding all question answered. 

## 2020-05-31 NOTE — Telephone Encounter (Signed)
-----   Message from Suzzanne Cloud, NP sent at 05/30/2020 10:55 AM EDT ----- Labs show no significant abnormality.

## 2020-06-13 ENCOUNTER — Other Ambulatory Visit: Payer: Self-pay | Admitting: Internal Medicine

## 2020-06-13 DIAGNOSIS — I10 Essential (primary) hypertension: Secondary | ICD-10-CM

## 2020-09-01 ENCOUNTER — Other Ambulatory Visit: Payer: Self-pay | Admitting: Internal Medicine

## 2020-09-01 DIAGNOSIS — B20 Human immunodeficiency virus [HIV] disease: Secondary | ICD-10-CM

## 2020-09-03 ENCOUNTER — Other Ambulatory Visit: Payer: Self-pay

## 2020-09-03 DIAGNOSIS — B20 Human immunodeficiency virus [HIV] disease: Secondary | ICD-10-CM

## 2020-09-03 MED ORDER — TRIUMEQ 600-50-300 MG PO TABS: ORAL_TABLET | ORAL | 4 refills | Status: DC

## 2020-09-03 MED ORDER — TIVICAY 50 MG PO TABS: 50.0000 mg | ORAL_TABLET | Freq: Every evening | ORAL | 4 refills | Status: DC

## 2020-09-11 ENCOUNTER — Other Ambulatory Visit: Payer: Medicare Other

## 2020-09-11 ENCOUNTER — Other Ambulatory Visit: Payer: Self-pay

## 2020-09-11 DIAGNOSIS — B20 Human immunodeficiency virus [HIV] disease: Secondary | ICD-10-CM

## 2020-09-12 LAB — T-HELPER CELL (CD4) - (RCID CLINIC ONLY)
CD4 % Helper T Cell: 48 % (ref 33–65)
CD4 T Cell Abs: 1501 /uL (ref 400–1790)

## 2020-09-13 ENCOUNTER — Other Ambulatory Visit: Payer: Self-pay | Admitting: Internal Medicine

## 2020-09-13 DIAGNOSIS — I10 Essential (primary) hypertension: Secondary | ICD-10-CM

## 2020-09-15 LAB — COMPREHENSIVE METABOLIC PANEL
AG Ratio: 1.5 (calc) (ref 1.0–2.5)
ALT: 20 U/L (ref 9–46)
AST: 22 U/L (ref 10–35)
Albumin: 4.4 g/dL (ref 3.6–5.1)
Alkaline phosphatase (APISO): 104 U/L (ref 35–144)
BUN: 11 mg/dL (ref 7–25)
CO2: 26 mmol/L (ref 20–32)
Calcium: 9.3 mg/dL (ref 8.6–10.3)
Chloride: 101 mmol/L (ref 98–110)
Creat: 0.91 mg/dL (ref 0.70–1.33)
Globulin: 2.9 g/dL (calc) (ref 1.9–3.7)
Glucose, Bld: 141 mg/dL — ABNORMAL HIGH (ref 65–99)
Potassium: 3.6 mmol/L (ref 3.5–5.3)
Sodium: 140 mmol/L (ref 135–146)
Total Bilirubin: 0.5 mg/dL (ref 0.2–1.2)
Total Protein: 7.3 g/dL (ref 6.1–8.1)

## 2020-09-15 LAB — CBC
HCT: 40.7 % (ref 38.5–50.0)
Hemoglobin: 13.7 g/dL (ref 13.2–17.1)
MCH: 29.3 pg (ref 27.0–33.0)
MCHC: 33.7 g/dL (ref 32.0–36.0)
MCV: 87 fL (ref 80.0–100.0)
MPV: 11.6 fL (ref 7.5–12.5)
Platelets: 210 10*3/uL (ref 140–400)
RBC: 4.68 10*6/uL (ref 4.20–5.80)
RDW: 13.3 % (ref 11.0–15.0)
WBC: 7.4 10*3/uL (ref 3.8–10.8)

## 2020-09-15 LAB — HIV-1 RNA QUANT-NO REFLEX-BLD
HIV 1 RNA Quant: <20 Copies/mL — ABNORMAL HIGH
HIV-1 RNA Quant, Log: <1.3 Log cps/mL — ABNORMAL HIGH

## 2020-09-15 LAB — RPR: RPR Ser Ql: NONREACTIVE

## 2020-09-26 ENCOUNTER — Encounter: Payer: Medicare Other | Admitting: Internal Medicine

## 2020-10-16 ENCOUNTER — Other Ambulatory Visit: Payer: Self-pay

## 2020-10-16 ENCOUNTER — Ambulatory Visit (INDEPENDENT_AMBULATORY_CARE_PROVIDER_SITE_OTHER): Payer: Medicare Other

## 2020-10-16 ENCOUNTER — Encounter: Payer: Self-pay | Admitting: Internal Medicine

## 2020-10-16 ENCOUNTER — Ambulatory Visit (INDEPENDENT_AMBULATORY_CARE_PROVIDER_SITE_OTHER): Payer: Medicare Other | Admitting: Internal Medicine

## 2020-10-16 DIAGNOSIS — Z23 Encounter for immunization: Secondary | ICD-10-CM

## 2020-10-16 DIAGNOSIS — B20 Human immunodeficiency virus [HIV] disease: Secondary | ICD-10-CM

## 2020-10-16 MED ORDER — TIVICAY 50 MG PO TABS: 50.0000 mg | ORAL_TABLET | Freq: Every evening | ORAL | 11 refills | Status: DC

## 2020-10-16 MED ORDER — TRIUMEQ 600-50-300 MG PO TABS: ORAL_TABLET | ORAL | 11 refills | Status: DC

## 2020-10-16 NOTE — Progress Notes (Signed)
   Covid-19 Vaccination Clinic  Name:  Tristan [name]    MRN: [id number] DOB: [birth year]  10/16/2020  [name] was observed post Covid-19 immunization for 15 minutes without incident. He was provided with Vaccine Information Sheet and instruction to access the V-Safe system.   [name] was instructed to call 911 with any severe reactions post vaccine: Marland Kitchen Difficulty breathing  . Swelling of face and throat  . A fast heartbeat  . A bad rash all over body  . Dizziness and weakness

## 2020-10-16 NOTE — Assessment & Plan Note (Signed)
His infection remains under excellent, long-term control.  He will continue his current regimen and follow-up after lab work in 1 year.  He did receive his Covid booster dose today.

## 2020-10-16 NOTE — Progress Notes (Signed)
Patient Active Problem List   Diagnosis Date Noted  . Human immunodeficiency virus (HIV) disease ([place]) 02/05/2007    Priority: High  . Latent tuberculosis by blood test 06/05/2016    Priority: Medium  . 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 Gait disorder 04/05/2015  . HERPES ZOSTER, UNCOMPLICATED 37/16/9678  . HYPERLIPIDEMIA 02/05/2007  . ABUSE, ALCOHOL, UNSPECIFIED 02/05/2007  . DISORDER, TOBACCO USE 02/05/2007  . HEMIPARESIS 02/05/2007  . Essential hypertension 02/05/2007  . Convulsions ([place]) 02/05/2007    Patient's Medications  New Prescriptions   No medications on file  Previous Medications   ASPIRIN EC 81 MG TABLET    Take 81 mg by mouth daily.     HYDROCHLOROTHIAZIDE (HYDRODIURIL) 50 MG TABLET    Take 1 tablet by mouth once daily   LEVETIRACETAM (KEPPRA) 500 MG TABLET    Take 1 tablet (500 mg total) by mouth 2 (two) times daily.   PHENYTOIN (DILANTIN) 100 MG ER CAPSULE    TAKE 2 CAPSULES BY MOUTH TWICE DAILY IN THE MORNING AND EVENING  Modified Medications   Modified Medication Previous Medication   ABACAVIR-DOLUTEGRAVIR-LAMIVUDINE (TRIUMEQ) 600-50-300 MG TABLET abacavir-dolutegravir-lamiVUDine (TRIUMEQ) 600-50-300 MG tablet      TAKE 1 TABLET BY MOUTH ONCE DAILY IN THE MORNING    TAKE 1 TABLET BY MOUTH ONCE DAILY IN THE MORNING   DOLUTEGRAVIR (TIVICAY) 50 MG TABLET dolutegravir (TIVICAY) 50 MG tablet      Take 1 tablet (50 mg total) by mouth every evening.    Take 1 tablet (50 mg total) by mouth every evening.  Discontinued Medications   No medications on file    Subjective: Tristan [name] is in for his routine HIV follow-up visit.  He has not had any problems taking or tolerating his Triumeq or Tivicay.  He recalls missing only 1 dose in the past year when his [place] was late filling his order.  He has never missed a dose when he had his medication.  He has already received his first 2 Pfizer Covid vaccines.  He would like to get his booster dose  here today.  He denies feeling anxious or depressed.  He is feeling well.  Review of Systems: Review of Systems  Constitutional: Negative for fever and weight loss.  Respiratory: Negative for cough and shortness of breath.   Cardiovascular: Negative for chest pain.  Psychiatric/Behavioral: Negative for depression. The patient is not nervous/anxious.     Past Medical History:  Diagnosis Date  . Abnormality of gait 04/04/2013  . ACHALASIA 02/05/2007   Annotation: Botox injection 2004 Qualifier: Diagnosis of  By: [name], Donald Jacque    . ALLERGIC RHINITIS 02/05/2007   Qualifier: Diagnosis of  By: [name], Latanza Pfefferkorn    . Chicken pox   . Dysphagia, oral phase 02/05/2007   Qualifier: Diagnosis of  By: [name], Teyana Pierron    . 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 Gait disorder 04/05/2015  . GERD 09/28/2007   Qualifier: Diagnosis of  By: [name], Epsie Walthall    . GYNECOMASTIA 02/05/2007   Annotation: transient, while on HART Qualifier: Diagnosis of  By: [name], Billye Pickerel    . Head injury    10/1990  . HIP FRACTURE, LEFT 02/05/2007   Annotation: 1/04, s/p ORIF Qualifier: Diagnosis of  By: [name], Charels Stambaugh    . HIV infection ([place])   . Hypertension   . LAPAROSCOPIC MYOTOMY AND FUNDOPLICATION 9/38/1017   Annotation: 8/10  Qualifier: Diagnosis of  By: [name], Daoud Lobue    . PNEUMONIA, HX OF 02/05/2007   Annotation: with pneumothorax 1991 Qualifier: Diagnosis of  By: [name], Mikia Delaluz    . Seasonal allergies   . Seizure ([place])   . Seizures ([place])     Social History   Tobacco Use  . Smoking status: Former Smoker    Packs/day: 0.50    Years: 23.00    Pack years: 11.50    Types: Cigarettes    Quit date: 06/21/1997    Years since quitting: 23.3  . Smokeless tobacco: Never Used  Substance Use Topics  . Alcohol use: No    Alcohol/week: 0.0 standard drinks  . Drug use: Yes    Frequency: 4.0 times per week    Types: Marijuana    Comment: regular basis-3 TIMES A WEEK    Family History    Problem Relation Age of Onset  . Colon cancer Mother 39  . Diabetes Mother   . Hypertension Father   . Stroke Father   . Diabetes Sister   . Diabetes Sister     No Known Allergies  Health Maintenance  Topic Date Due  . COVID-19 Vaccine (1) Never done  . TETANUS/TDAP  Never done  . COLONOSCOPY  07/05/2018  . INFLUENZA VACCINE  07/22/2020  . Hepatitis C Screening  Completed  . HIV Screening  Completed    Objective:  Vitals:   10/16/20 1353  BP: (!) 163/79  Pulse: 71  Temp: 98 F (36.7 C)  Weight: 166 lb (75.3 kg)  Height: 6' (1.829 m)   Body mass index is 22.51 kg/m.  Physical Exam Constitutional:      Comments: He is in good spirits as usual.  Cardiovascular:     Rate and Rhythm: Normal rate.     Heart sounds: No murmur heard.   Pulmonary:     Effort: Pulmonary effort is normal.     Breath sounds: Normal breath sounds.  Psychiatric:        Mood and Affect: Mood normal.     Lab Results Lab Results  Component Value Date   WBC 7.4 09/11/2020   HGB 13.7 09/11/2020   HCT 40.7 09/11/2020   MCV 87.0 09/11/2020   PLT 210 09/11/2020    Lab Results  Component Value Date   CREATININE 0.91 09/11/2020   BUN 11 09/11/2020   NA 140 09/11/2020   K 3.6 09/11/2020   CL 101 09/11/2020   CO2 26 09/11/2020    Lab Results  Component Value Date   ALT 20 09/11/2020   AST 22 09/11/2020   ALKPHOS 112 05/29/2020   BILITOT 0.5 09/11/2020    Lab Results  Component Value Date   CHOL 190 08/27/2017   HDL 56 08/27/2017   LDLCALC 98 08/27/2017   TRIG 236 (H) 08/27/2017   CHOLHDL 3.4 08/27/2017   Lab Results  Component Value Date   LABRPR NON-REACTIVE 09/11/2020   HIV 1 RNA Quant  Date Value  09/11/2020 <20 Copies/mL (H)  08/30/2019 <20 DETECTED copies/mL (A)  08/25/2018 21 copies/mL (H)   CD4 T Cell Abs (/uL)  Date Value  09/11/2020 1,501  08/30/2019 1,441  08/25/2018 1,790     Problem List Items Addressed This Visit      High   Human  immunodeficiency virus (HIV) disease ([place])    His infection remains under excellent, long-term control.  He will continue his current regimen and follow-up after lab work in 1 year.  He did receive his Covid booster dose today.      Relevant Medications   abacavir-dolutegravir-lamiVUDine (TRIUMEQ) 276-14-709 MG tablet   dolutegravir (TIVICAY) 50 MG tablet   Other Relevant Orders   CBC   T-helper cell (CD4)- ([facility] clinic only)   Comprehensive metabolic panel   RPR   HIV-1 RNA quant-no reflex-bld        Michel Bickers, MD [facility] for [place] [address] [phone] pager   [phone] cell 10/16/2020, 2:10 PM

## 2020-12-22 ENCOUNTER — Other Ambulatory Visit: Payer: Self-pay | Admitting: Internal Medicine

## 2020-12-22 DIAGNOSIS — I10 Essential (primary) hypertension: Secondary | ICD-10-CM

## 2021-03-27 ENCOUNTER — Other Ambulatory Visit: Payer: Self-pay | Admitting: Internal Medicine

## 2021-03-27 DIAGNOSIS — I10 Essential (primary) hypertension: Secondary | ICD-10-CM

## 2021-05-20 ENCOUNTER — Other Ambulatory Visit: Payer: Self-pay | Admitting: Neurology

## 2021-06-03 ENCOUNTER — Other Ambulatory Visit: Payer: Self-pay

## 2021-06-03 ENCOUNTER — Ambulatory Visit (INDEPENDENT_AMBULATORY_CARE_PROVIDER_SITE_OTHER): Payer: Medicare Other | Admitting: Neurology

## 2021-06-03 ENCOUNTER — Encounter: Payer: Self-pay | Admitting: Neurology

## 2021-06-03 VITALS — BP 169/91 | HR 65 | Ht 75.0 in | Wt 161.0 lb

## 2021-06-03 DIAGNOSIS — G40109 Localization-related (focal) (partial) symptomatic epilepsy and epileptic syndromes with simple partial seizures, not intractable, without status epilepticus: Secondary | ICD-10-CM

## 2021-06-03 MED ORDER — PHENYTOIN SODIUM EXTENDED 100 MG PO CAPS: ORAL_CAPSULE | ORAL | 4 refills | Status: DC

## 2021-06-03 MED ORDER — LEVETIRACETAM 500 MG PO TABS: 500.0000 mg | ORAL_TABLET | Freq: Two times a day (BID) | ORAL | 4 refills | Status: DC

## 2021-06-03 NOTE — Patient Instructions (Signed)
Continue current medication  Check labs today  Call for seizure  See you back in 1 year  Please see your primary care doctor for physical
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
## 2021-06-03 NOTE — Progress Notes (Signed)
I have read the note, and I agree with the clinical assessment and plan.  Dayvon Dax K Christabella Alvira   

## 2021-06-04 LAB — COMPREHENSIVE METABOLIC PANEL
ALT: 20 IU/L (ref 0–44)
AST: 19 IU/L (ref 0–40)
Albumin/Globulin Ratio: 1.6 (ref 1.2–2.2)
Albumin: 4.6 g/dL (ref 3.8–4.9)
Alkaline Phosphatase: 110 IU/L (ref 44–121)
BUN/Creatinine Ratio: 12 (ref 9–20)
BUN: 11 mg/dL (ref 6–24)
Bilirubin Total: <0.2 mg/dL (ref 0.0–1.2)
CO2: 24 mmol/L (ref 20–29)
Calcium: 9.1 mg/dL (ref 8.7–10.2)
Chloride: 103 mmol/L (ref 96–106)
Creatinine, Ser: 0.94 mg/dL (ref 0.76–1.27)
Globulin, Total: 2.9 g/dL (ref 1.5–4.5)
Glucose: 74 mg/dL (ref 65–99)
Potassium: 3.5 mmol/L (ref 3.5–5.2)
Sodium: 142 mmol/L (ref 134–144)
Total Protein: 7.5 g/dL (ref 6.0–8.5)
eGFR: 94 mL/min/{1.73_m2} (ref >59)

## 2021-06-04 LAB — CBC WITH DIFFERENTIAL/PLATELET
Basophils Absolute: 0 10*3/uL (ref 0.0–0.2)
Basos: 0 %
EOS (ABSOLUTE): 0 10*3/uL (ref 0.0–0.4)
Eos: 0 %
Hematocrit: 42.3 % (ref 37.5–51.0)
Hemoglobin: 13.9 g/dL (ref 13.0–17.7)
Immature Grans (Abs): 0 10*3/uL (ref 0.0–0.1)
Immature Granulocytes: 0 %
Lymphocytes Absolute: 4 10*3/uL — ABNORMAL HIGH (ref 0.7–3.1)
Lymphs: 46 %
MCH: 28.6 pg (ref 26.6–33.0)
MCHC: 32.9 g/dL (ref 31.5–35.7)
MCV: 87 fL (ref 79–97)
Monocytes Absolute: 0.6 10*3/uL (ref 0.1–0.9)
Monocytes: 6 %
Neutrophils Absolute: 4 10*3/uL (ref 1.4–7.0)
Neutrophils: 48 %
Platelets: 182 10*3/uL (ref 150–450)
RBC: 4.86 x10E6/uL (ref 4.14–5.80)
RDW: 14 % (ref 11.6–15.4)
WBC: 8.6 10*3/uL (ref 3.4–10.8)

## 2021-06-04 LAB — PHENYTOIN LEVEL, TOTAL: Phenytoin (Dilantin), Serum: 20.8 ug/mL (ref 10.0–20.0)

## 2021-06-10 ENCOUNTER — Telehealth: Payer: Self-pay | Admitting: *Deleted

## 2021-06-10 NOTE — Telephone Encounter (Signed)
VM requesting call back for lab results.

## 2021-06-10 NOTE — Telephone Encounter (Signed)
Patient returned call. Informed him that NP stated his bood work looks good except for mildly elevated Dilantin level 20.8. There were no signs of toxicity. If he took his dose close to his lab draw this may explain this. Advised that level was slightly high and we can recheck it or leave as is, as [name] suspects this was dose timing related to blood draw. I reviewed signs of toxicity: dizziness, gait instability, confusion, tremor,etc.to watch for. He denied any of those signs, symptoms verbalized understanding, appreciation.

## 2021-06-28 ENCOUNTER — Other Ambulatory Visit: Payer: Self-pay | Admitting: Internal Medicine

## 2021-06-28 DIAGNOSIS — I10 Essential (primary) hypertension: Secondary | ICD-10-CM

## 2021-09-29 ENCOUNTER — Other Ambulatory Visit: Payer: Self-pay | Admitting: Internal Medicine

## 2021-09-29 DIAGNOSIS — I10 Essential (primary) hypertension: Secondary | ICD-10-CM

## 2021-09-30 NOTE — Telephone Encounter (Signed)
Please advise on refill.

## 2021-10-01 ENCOUNTER — Other Ambulatory Visit: Payer: Self-pay | Admitting: Internal Medicine

## 2021-10-01 DIAGNOSIS — I10 Essential (primary) hypertension: Secondary | ICD-10-CM

## 2021-10-01 MED ORDER — HYDROCHLOROTHIAZIDE 50 MG PO TABS: 50.0000 mg | ORAL_TABLET | Freq: Every day | ORAL | 3 refills | Status: DC

## 2021-10-08 ENCOUNTER — Other Ambulatory Visit: Payer: Medicare Other

## 2021-10-22 ENCOUNTER — Encounter: Payer: Medicare Other | Admitting: Internal Medicine

## 2021-10-24 ENCOUNTER — Other Ambulatory Visit: Payer: Medicare Other

## 2021-10-26 ENCOUNTER — Other Ambulatory Visit: Payer: Self-pay | Admitting: Internal Medicine

## 2021-10-26 DIAGNOSIS — B20 Human immunodeficiency virus [HIV] disease: Secondary | ICD-10-CM

## 2021-10-28 ENCOUNTER — Other Ambulatory Visit: Payer: Medicare Other

## 2021-10-28 ENCOUNTER — Other Ambulatory Visit: Payer: Self-pay

## 2021-10-28 DIAGNOSIS — Z79899 Other long term (current) drug therapy: Secondary | ICD-10-CM

## 2021-10-28 DIAGNOSIS — B20 Human immunodeficiency virus [HIV] disease: Secondary | ICD-10-CM

## 2021-10-28 DIAGNOSIS — Z113 Encounter for screening for infections with a predominantly sexual mode of transmission: Secondary | ICD-10-CM

## 2021-10-29 LAB — T-HELPER CELL (CD4) - (RCID CLINIC ONLY)
CD4 % Helper T Cell: 50 % (ref 33–65)
CD4 T Cell Abs: 1854 /uL — ABNORMAL HIGH (ref 400–1790)

## 2021-10-30 LAB — CBC WITH DIFFERENTIAL/PLATELET
Absolute Monocytes: 483 cells/uL (ref 200–950)
Basophils Absolute: 21 cells/uL (ref 0–200)
Basophils Relative: 0.3 %
Eosinophils Absolute: 41 cells/uL (ref 15–500)
Eosinophils Relative: 0.6 %
HCT: 41.9 % (ref 38.5–50.0)
Hemoglobin: 14.2 g/dL (ref 13.2–17.1)
Lymphs Abs: 3988 cells/uL — ABNORMAL HIGH (ref 850–3900)
MCH: 29.6 pg (ref 27.0–33.0)
MCHC: 33.9 g/dL (ref 32.0–36.0)
MCV: 87.5 fL (ref 80.0–100.0)
MPV: 11.7 fL (ref 7.5–12.5)
Monocytes Relative: 7 %
Neutro Abs: 2367 cells/uL (ref 1500–7800)
Neutrophils Relative %: 34.3 %
Platelets: 185 10*3/uL (ref 140–400)
RBC: 4.79 10*6/uL (ref 4.20–5.80)
RDW: 13.3 % (ref 11.0–15.0)
Total Lymphocyte: 57.8 %
WBC: 6.9 10*3/uL (ref 3.8–10.8)

## 2021-10-30 LAB — HIV-1 RNA QUANT-NO REFLEX-BLD
HIV 1 RNA Quant: NOT DETECTED Copies/mL
HIV-1 RNA Quant, Log: NOT DETECTED Log cps/mL

## 2021-10-30 LAB — COMPLETE METABOLIC PANEL WITH GFR
AG Ratio: 1.5 (calc) (ref 1.0–2.5)
ALT: 27 U/L (ref 9–46)
AST: 24 U/L (ref 10–35)
Albumin: 4.5 g/dL (ref 3.6–5.1)
Alkaline phosphatase (APISO): 92 U/L (ref 35–144)
BUN: 12 mg/dL (ref 7–25)
CO2: 30 mmol/L (ref 20–32)
Calcium: 9.4 mg/dL (ref 8.6–10.3)
Chloride: 103 mmol/L (ref 98–110)
Creat: 0.88 mg/dL (ref 0.70–1.30)
Globulin: 3.1 g/dL (calc) (ref 1.9–3.7)
Glucose, Bld: 82 mg/dL (ref 65–99)
Potassium: 3.7 mmol/L (ref 3.5–5.3)
Sodium: 141 mmol/L (ref 135–146)
Total Bilirubin: 0.2 mg/dL (ref 0.2–1.2)
Total Protein: 7.6 g/dL (ref 6.1–8.1)
eGFR: 99 mL/min/{1.73_m2} (ref >=60)

## 2021-10-30 LAB — LIPID PANEL
Cholesterol: 209 mg/dL — ABNORMAL HIGH (ref <200)
HDL: 46 mg/dL (ref >=40)
LDL Cholesterol (Calc): 129 mg/dL (calc) — ABNORMAL HIGH
Non-HDL Cholesterol (Calc): 163 mg/dL (calc) — ABNORMAL HIGH (ref <130)
Total CHOL/HDL Ratio: 4.5 (calc) (ref <5.0)
Triglycerides: 202 mg/dL — ABNORMAL HIGH (ref <150)

## 2021-10-30 LAB — RPR: RPR Ser Ql: NONREACTIVE

## 2021-11-05 ENCOUNTER — Encounter: Payer: Medicare Other | Admitting: Internal Medicine

## 2021-11-12 ENCOUNTER — Other Ambulatory Visit: Payer: Self-pay

## 2021-11-12 ENCOUNTER — Ambulatory Visit (INDEPENDENT_AMBULATORY_CARE_PROVIDER_SITE_OTHER): Payer: Medicare Other | Admitting: Internal Medicine

## 2021-11-12 VITALS — BP 161/88 | HR 76 | Temp 97.9°F | Resp 16 | Wt 158.0 lb

## 2021-11-12 DIAGNOSIS — B20 Human immunodeficiency virus [HIV] disease: Secondary | ICD-10-CM | POA: Diagnosis not present

## 2021-11-12 DIAGNOSIS — R634 Abnormal weight loss: Secondary | ICD-10-CM | POA: Diagnosis not present

## 2021-11-12 DIAGNOSIS — Z23 Encounter for immunization: Secondary | ICD-10-CM | POA: Diagnosis not present

## 2021-11-12 NOTE — Progress Notes (Signed)
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     HYDROCHLOROTHIAZIDE (HYDRODIURIL) 50 MG TABLET    Take 1 tablet (50 mg total) by mouth daily.   LEVETIRACETAM (KEPPRA) 500 MG TABLET    Take 1 tablet (500 mg total) by mouth 2 (two) times daily.   PHENYTOIN (DILANTIN) 100 MG ER CAPSULE    TAKE 2 CAPSULES BY MOUTH TWICE DAILY IN THE MORNING AND EVENING   TIVICAY 50 MG TABLET    TAKE 1 TABLET BY MOUTH EVERY EVENING   TRIUMEQ 600-50-300 MG TABLET    TAKE 1 TABLET BY MOUTH ONCE DAILY IN THE MORNING  Modified Medications   No medications on file  Discontinued Medications   No medications on file    Subjective: Tristan [name] is in for his routine HIV follow-up visit.  He denies any problems obtaining, taking or tolerating his Triumeq or Tivicay.  He says that he never misses doses.  He has not had any seizure since his last visit.  It has been 1 year since he had a COVID booster vaccine.  He recently changed his diet.  He is eating fewer processed foods and trying to focus on healthier snacks.  He is surprised that he has lost this much weight, however.  He has not changed his activity level.  Review of Systems: Review of Systems  Constitutional:  Positive for weight loss. Negative for chills, diaphoresis, fever and malaise/fatigue.  HENT:  Negative for sore throat.   Respiratory:   Negative for cough, sputum production and shortness of breath.   Cardiovascular:  Negative for chest pain.  Gastrointestinal:  Negative for abdominal pain, diarrhea, heartburn, nausea and vomiting.  Genitourinary:  Negative for dysuria and frequency.  Musculoskeletal:  Negative for joint pain and myalgias.  Skin:  Negative for rash.  Neurological:  Positive for focal weakness. Negative for dizziness, seizures and headaches.  Psychiatric/Behavioral:  Negative for depression and substance abuse. The patient is not nervous/anxious.    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   Gait disorder 04/05/2015   GERD 09/28/2007   Qualifier: Diagnosis of  By: [name], [name] 02/05/2007   Annotation: transient, while on HART Qualifier: Diagnosis of  By: [name], Jaliya Siegmann     Head injury    10/1990   HIP FRACTURE, LEFT 02/05/2007   Annotation: 1/04, s/p  ORIF Qualifier: Diagnosis of  By: [name], Jori Thrall     HIV infection [facility])    Hypertension    LAPAROSCOPIC MYOTOMY AND FUNDOPLICATION 1/61/0960   Annotation: 8/10 Qualifier: Diagnosis of  By: [name], Estanislado Surgeon     PNEUMONIA, HX OF 02/05/2007   Annotation: with pneumothorax 1991 Qualifier: Diagnosis of  By: [name], Morgan Rennert     Seasonal allergies    Seizure ([place])    Seizures ([place])     Social History   Tobacco Use   Smoking status: Former    Packs/day: 0.50    Years: 23.00    Pack years: 11.50    Types: Cigarettes    Quit date: 06/21/1997    Years since quitting: 24.4   Smokeless tobacco: Never  Substance Use Topics   Alcohol use: No    Alcohol/week: 0.0  standard drinks   Drug use: Yes    Frequency: 4.0 times per week    Types: Marijuana    Comment: regular basis-3 TIMES A WEEK    Family History  Problem Relation Age of Onset   Colon cancer Mother 18   Diabetes Mother    Hypertension Father    Stroke Father    Diabetes Sister    Diabetes Sister     No Known Allergies  Health Maintenance  Topic Date Due   TETANUS/TDAP  Never done   Zoster Vaccines- Shingrix (1 of 2) Never done   COLONOSCOPY (Pts 45-33yrs Insurance coverage will need to be confirmed)  07/05/2018   COVID-19 Vaccine (2 - Pfizer risk series) 11/06/2020   Pneumococcal Vaccine 93-67 Years old (4 - PPSV23 if available, else PCV20) 10/20/2027   INFLUENZA VACCINE  Completed   Hepatitis C Screening  Completed   HIV Screening  Completed   HPV VACCINES  Aged Out    Objective:  Vitals:   11/12/21 1423  BP: (!) 161/88  Pulse: 76  Resp: 16  Temp: 97.9 F (36.6 C)  TempSrc: Temporal  SpO2: 100%  Weight: 158 lb (71.7 kg)   Body mass index is 19.75 kg/m.  Physical Exam Constitutional:      Comments: He is in good spirits.  His weight is down about 10 pounds over the past year.  Cardiovascular:     Rate and Rhythm: Normal rate and regular rhythm.     Heart sounds: No murmur heard. Pulmonary:     Effort: Pulmonary effort is normal.     Breath sounds: Normal breath sounds.  Abdominal:     Palpations: Abdomen is soft.     Tenderness: There is no abdominal tenderness.  Lymphadenopathy:     Cervical: No cervical adenopathy.     Upper Body:     Right upper body: No supraclavicular or axillary adenopathy.     Left upper body: No supraclavicular or axillary adenopathy.  Skin:    Findings: No rash.  Neurological:     Motor: Weakness present.     Gait: Gait abnormal.  Psychiatric:        Mood and Affect: Mood normal.    Lab Results Lab Results  Component Value Date   WBC 6.9 10/28/2021   HGB 14.2 10/28/2021   HCT 41.9 10/28/2021   MCV 87.5 10/28/2021    PLT 185 10/28/2021    Lab Results  Component Value Date   CREATININE 0.88 10/28/2021   BUN 12 10/28/2021   NA 141 10/28/2021   K 3.7 10/28/2021   CL 103 10/28/2021   CO2 30  10/28/2021    Lab Results  Component Value Date   ALT 27 10/28/2021   AST 24 10/28/2021   ALKPHOS 110 06/03/2021   BILITOT 0.2 10/28/2021    Lab Results  Component Value Date   CHOL 209 (H) 10/28/2021   HDL 46 10/28/2021   LDLCALC 129 (H) 10/28/2021   TRIG 202 (H) 10/28/2021   CHOLHDL 4.5 10/28/2021   Lab Results  Component Value Date   LABRPR NON-REACTIVE 10/28/2021   HIV 1 RNA Quant  Date Value  10/28/2021 Not Detected Copies/mL  09/11/2020 <20 Copies/mL (H)  08/30/2019 <20 DETECTED copies/mL (A)   CD4 T Cell Abs (/uL)  Date Value  10/28/2021 1,854 (H)  09/11/2020 1,501  08/30/2019 1,441     Problem List Items Addressed This Visit       High   Human immunodeficiency virus (HIV) disease ([place])    His infection remains under excellent, long-term control.  He will continue Triumeq and Tivicay and follow-up after lab work in 1 year.  He received his annual influenza and COVID booster vaccines here today.      Relevant Orders   CBC   T-helper cell (CD4)- ([facility] clinic only)   Comprehensive metabolic panel   Lipid panel   RPR   HIV-1 RNA quant-no reflex-bld     Unprioritized   Weight loss    Based on his history it seems as though he had started losing weight unintentionally before he changed his diet recently.  I asked him to weigh himself on a regular basis and let me know if he is continuing to lose weight.  All of his recent lab work looks good and there is nothing on exam today to suggest some new underlying illness.  I also suggested that he follow-up with his PCP.      Other Visit Diagnoses     Need for immunization against influenza    -  Primary   Relevant Orders   Flu Vaccine QUAD 48mo+IM (Fluarix, Fluzone & Alfiuria Quad PF) (Completed)         Michel Bickers,  MD [facility] [place] [phone] pager   [phone] cell 11/12/2021, 3:05 PM

## 2021-11-12 NOTE — Assessment & Plan Note (Signed)
Based on his history it seems as though he had started losing weight unintentionally before he changed his diet recently.  I asked him to weigh himself on a regular basis and let me know if he is continuing to lose weight.  All of his recent lab work looks good and there is nothing on exam today to suggest some new underlying illness.  I also suggested that he follow-up with his PCP.

## 2021-11-12 NOTE — Assessment & Plan Note (Signed)
His infection remains under excellent, long-term control.  He will continue Triumeq and Tivicay and follow-up after lab work in 1 year.  He received his annual influenza and COVID booster vaccines here today.

## 2021-11-26 ENCOUNTER — Other Ambulatory Visit: Payer: Self-pay | Admitting: Internal Medicine

## 2021-11-26 DIAGNOSIS — B20 Human immunodeficiency virus [HIV] disease: Secondary | ICD-10-CM

## 2021-11-26 NOTE — Telephone Encounter (Signed)
All good. Patient's HIV regimen has been adjusted for the interaction - he is taking an extra dolutegravir at night making it BID. Thanks!

## 2021-11-26 NOTE — Telephone Encounter (Signed)
DDI between ART and phenytoin, please advise, thanks!

## 2022-06-03 ENCOUNTER — Encounter: Payer: Self-pay | Admitting: Neurology

## 2022-06-03 ENCOUNTER — Ambulatory Visit: Payer: Medicare Other | Admitting: Neurology
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
## 2022-08-19 ENCOUNTER — Other Ambulatory Visit: Payer: Self-pay | Admitting: Neurology

## 2022-08-30 ENCOUNTER — Other Ambulatory Visit: Payer: Self-pay | Admitting: Neurology

## 2022-09-01 NOTE — Telephone Encounter (Signed)
Patient needs appointment for refills

## 2022-09-26 ENCOUNTER — Other Ambulatory Visit: Payer: Self-pay | Admitting: Internal Medicine

## 2022-09-26 DIAGNOSIS — I10 Essential (primary) hypertension: Secondary | ICD-10-CM

## 2022-09-26 NOTE — Telephone Encounter (Signed)
Okay to refill? 

## 2022-10-28 ENCOUNTER — Other Ambulatory Visit: Payer: Self-pay

## 2022-10-28 ENCOUNTER — Other Ambulatory Visit: Payer: Medicare Other

## 2022-10-28 DIAGNOSIS — B20 Human immunodeficiency virus [HIV] disease: Secondary | ICD-10-CM

## 2022-10-29 LAB — T-HELPER CELL (CD4) - (RCID CLINIC ONLY)
CD4 % Helper T Cell: 44 % (ref 33–65)
CD4 T Cell Abs: 1904 /uL — ABNORMAL HIGH (ref 400–1790)

## 2022-10-30 LAB — COMPREHENSIVE METABOLIC PANEL
AG Ratio: 1.4 (calc) (ref 1.0–2.5)
ALT: 27 U/L (ref 9–46)
AST: 34 U/L (ref 10–35)
Albumin: 4.6 g/dL (ref 3.6–5.1)
Alkaline phosphatase (APISO): 60 U/L (ref 35–144)
BUN: 14 mg/dL (ref 7–25)
CO2: 31 mmol/L (ref 20–32)
Calcium: 10 mg/dL (ref 8.6–10.3)
Chloride: 99 mmol/L (ref 98–110)
Creat: 0.95 mg/dL (ref 0.70–1.35)
Globulin: 3.2 g/dL (calc) (ref 1.9–3.7)
Glucose, Bld: 109 mg/dL — ABNORMAL HIGH (ref 65–99)
Potassium: 3.5 mmol/L (ref 3.5–5.3)
Sodium: 140 mmol/L (ref 135–146)
Total Bilirubin: 0.4 mg/dL (ref 0.2–1.2)
Total Protein: 7.8 g/dL (ref 6.1–8.1)

## 2022-10-30 LAB — CBC
HCT: 43.7 % (ref 38.5–50.0)
Hemoglobin: 14.1 g/dL (ref 13.2–17.1)
MCH: 28.3 pg (ref 27.0–33.0)
MCHC: 32.3 g/dL (ref 32.0–36.0)
MCV: 87.6 fL (ref 80.0–100.0)
MPV: 12.2 fL (ref 7.5–12.5)
Platelets: 181 10*3/uL (ref 140–400)
RBC: 4.99 10*6/uL (ref 4.20–5.80)
RDW: 13.6 % (ref 11.0–15.0)
WBC: 9.5 10*3/uL (ref 3.8–10.8)

## 2022-10-30 LAB — LIPID PANEL
Cholesterol: 201 mg/dL — ABNORMAL HIGH (ref <200)
HDL: 46 mg/dL (ref >=40)
LDL Cholesterol (Calc): 109 mg/dL (calc) — ABNORMAL HIGH
Non-HDL Cholesterol (Calc): 155 mg/dL (calc) — ABNORMAL HIGH (ref <130)
Total CHOL/HDL Ratio: 4.4 (calc) (ref <5.0)
Triglycerides: 348 mg/dL — ABNORMAL HIGH (ref <150)

## 2022-10-30 LAB — HIV-1 RNA QUANT-NO REFLEX-BLD
HIV 1 RNA Quant: <20 Copies/mL — ABNORMAL HIGH
HIV-1 RNA Quant, Log: <1.3 Log cps/mL — ABNORMAL HIGH

## 2022-10-30 LAB — RPR: RPR Ser Ql: NONREACTIVE

## 2022-11-03 ENCOUNTER — Encounter (HOSPITAL_COMMUNITY): Payer: Medicare Other

## 2022-11-03 ENCOUNTER — Emergency Department (HOSPITAL_COMMUNITY): Payer: Medicare Other

## 2022-11-03 ENCOUNTER — Inpatient Hospital Stay (HOSPITAL_COMMUNITY)
Admission: EM | Admit: 2022-11-03 | Discharge: 2022-11-07 | DRG: 101 | Disposition: A | Discharge status: Home Health | Payer: Medicare Other | Attending: Internal Medicine | Admitting: Internal Medicine

## 2022-11-03 ENCOUNTER — Other Ambulatory Visit: Payer: Self-pay

## 2022-11-03 DIAGNOSIS — I10 Essential (primary) hypertension: Secondary | ICD-10-CM | POA: Diagnosis present

## 2022-11-03 DIAGNOSIS — Z21 Asymptomatic human immunodeficiency virus [HIV] infection status: Secondary | ICD-10-CM | POA: Diagnosis present

## 2022-11-03 DIAGNOSIS — Z87891 Personal history of nicotine dependence: Secondary | ICD-10-CM | POA: Diagnosis not present

## 2022-11-03 DIAGNOSIS — G40109 Localization-related (focal) (partial) symptomatic epilepsy and epileptic syndromes with simple partial seizures, not intractable, without status epilepticus: Secondary | ICD-10-CM | POA: Diagnosis present

## 2022-11-03 DIAGNOSIS — Z7982 Long term (current) use of aspirin: Secondary | ICD-10-CM | POA: Diagnosis not present

## 2022-11-03 DIAGNOSIS — G8194 Hemiplegia, unspecified affecting left nondominant side: Secondary | ICD-10-CM | POA: Diagnosis present

## 2022-11-03 DIAGNOSIS — Z87820 Personal history of traumatic brain injury: Secondary | ICD-10-CM

## 2022-11-03 DIAGNOSIS — Z8249 Family history of ischemic heart disease and other diseases of the circulatory system: Secondary | ICD-10-CM | POA: Diagnosis not present

## 2022-11-03 DIAGNOSIS — R7989 Other specified abnormal findings of blood chemistry: Secondary | ICD-10-CM | POA: Diagnosis present

## 2022-11-03 DIAGNOSIS — K219 Gastro-esophageal reflux disease without esophagitis: Secondary | ICD-10-CM | POA: Diagnosis present

## 2022-11-03 DIAGNOSIS — Z79899 Other long term (current) drug therapy: Secondary | ICD-10-CM

## 2022-11-03 DIAGNOSIS — R569 Unspecified convulsions: Secondary | ICD-10-CM | POA: Diagnosis present

## 2022-11-03 DIAGNOSIS — Z91148 Patient's other noncompliance with medication regimen for other reason: Secondary | ICD-10-CM

## 2022-11-03 DIAGNOSIS — E876 Hypokalemia: Secondary | ICD-10-CM | POA: Diagnosis not present

## 2022-11-03 LAB — CBC WITH DIFFERENTIAL/PLATELET
Abs Immature Granulocytes: 0.02 10*3/uL (ref 0.00–0.07)
Basophils Absolute: 0 10*3/uL (ref 0.0–0.1)
Basophils Relative: 0 %
Eosinophils Absolute: 0.2 10*3/uL (ref 0.0–0.5)
Eosinophils Relative: 2 %
HCT: 39.9 % (ref 39.0–52.0)
Hemoglobin: 13 g/dL (ref 13.0–17.0)
Immature Granulocytes: 0 %
Lymphocytes Relative: 29 %
Lymphs Abs: 2.2 10*3/uL (ref 0.7–4.0)
MCH: 29.3 pg (ref 26.0–34.0)
MCHC: 32.6 g/dL (ref 30.0–36.0)
MCV: 89.9 fL (ref 80.0–100.0)
Monocytes Absolute: 0.6 10*3/uL (ref 0.1–1.0)
Monocytes Relative: 8 %
Neutro Abs: 4.5 10*3/uL (ref 1.7–7.7)
Neutrophils Relative %: 61 %
Platelets: 199 10*3/uL (ref 150–400)
RBC: 4.44 MIL/uL (ref 4.22–5.81)
RDW: 14.3 % (ref 11.5–15.5)
WBC: 7.6 10*3/uL (ref 4.0–10.5)
nRBC: 0 % (ref 0.0–0.2)

## 2022-11-03 LAB — COMPREHENSIVE METABOLIC PANEL
ALT: 25 U/L (ref 0–44)
AST: 32 U/L (ref 15–41)
Albumin: 3.7 g/dL (ref 3.5–5.0)
Alkaline Phosphatase: 45 U/L (ref 38–126)
Anion gap: 8 (ref 5–15)
BUN: 6 mg/dL (ref 6–20)
CO2: 27 mmol/L (ref 22–32)
Calcium: 9 mg/dL (ref 8.9–10.3)
Chloride: 104 mmol/L (ref 98–111)
Creatinine, Ser: 1.01 mg/dL (ref 0.61–1.24)
GFR, Estimated: >60 mL/min (ref >60)
Glucose, Bld: 93 mg/dL (ref 70–99)
Potassium: 3.7 mmol/L (ref 3.5–5.1)
Sodium: 139 mmol/L (ref 135–145)
Total Bilirubin: 0.5 mg/dL (ref 0.3–1.2)
Total Protein: 6.8 g/dL (ref 6.5–8.1)

## 2022-11-03 LAB — PROTIME-INR
INR: 1 (ref 0.8–1.2)
Prothrombin Time: 13.3 seconds (ref 11.4–15.2)

## 2022-11-03 LAB — CK: Total CK: 787 U/L — ABNORMAL HIGH (ref 49–397)

## 2022-11-03 LAB — CBG MONITORING, ED: Glucose-Capillary: 104 mg/dL — ABNORMAL HIGH (ref 70–99)

## 2022-11-03 LAB — PHENYTOIN LEVEL, TOTAL: Phenytoin Lvl: <2.5 ug/mL — ABNORMAL LOW (ref 10.0–20.0)

## 2022-11-03 MED ORDER — SODIUM CHLORIDE 0.9 % IV SOLN
20.0000 mg/kg | Freq: Once | INTRAVENOUS | Status: AC
  Administered 2022-11-03: 1452 mg via INTRAVENOUS
  Filled 2022-11-03: qty 29.04

## 2022-11-03 MED ORDER — ACETAMINOPHEN 500 MG PO TABS
1000.0000 mg | ORAL_TABLET | Freq: Once | ORAL | Status: AC
  Administered 2022-11-03: 1000 mg via ORAL
  Filled 2022-11-03: qty 2

## 2022-11-03 MED ORDER — LORAZEPAM 2 MG/ML IJ SOLN
2.0000 mg | Freq: Once | INTRAMUSCULAR | Status: AC
  Administered 2022-11-03: 2 mg via INTRAVENOUS
  Filled 2022-11-03: qty 1

## 2022-11-03 MED ORDER — MIDAZOLAM HCL (PF) 5 MG/ML IJ SOLN
INTRAMUSCULAR | Status: AC
  Administered 2022-11-03: 5 mg
  Filled 2022-11-03: qty 1

## 2022-11-03 MED ORDER — ENOXAPARIN SODIUM 40 MG/0.4ML IJ SOSY
40.0000 mg | PREFILLED_SYRINGE | INTRAMUSCULAR | Status: DC
  Administered 2022-11-03 – 2022-11-06 (×4): 40 mg via SUBCUTANEOUS
  Filled 2022-11-03 (×4): qty 0.4

## 2022-11-03 MED ORDER — ORAL CARE MOUTH RINSE: 15.0000 mL | OROMUCOSAL | Status: DC | PRN

## 2022-11-03 MED ORDER — LEVETIRACETAM IN NACL 1000 MG/100ML IV SOLN
1000.0000 mg | Freq: Once | INTRAVENOUS | Status: AC
  Administered 2022-11-03: 1000 mg via INTRAVENOUS
  Filled 2022-11-03: qty 100

## 2022-11-03 MED ORDER — SODIUM CHLORIDE 0.9 % IV SOLN
2000.0000 mg | Freq: Once | INTRAVENOUS | Status: AC
  Administered 2022-11-03: 2000 mg via INTRAVENOUS
  Filled 2022-11-03: qty 20

## 2022-11-03 MED ORDER — SODIUM CHLORIDE 0.9 % IV SOLN: 75.0000 mL/h | INTRAVENOUS | Status: DC

## 2022-11-03 MED ORDER — MIDAZOLAM HCL 2 MG/2ML IJ SOLN: 5.0000 mg | Freq: Once | INTRAMUSCULAR | Status: DC

## 2022-11-03 MED ORDER — PHENYTOIN SODIUM 50 MG/ML IJ SOLN
100.0000 mg | Freq: Three times a day (TID) | INTRAMUSCULAR | Status: DC
  Administered 2022-11-04 – 2022-11-07 (×11): 100 mg via INTRAVENOUS
  Filled 2022-11-03 (×15): qty 2

## 2022-11-03 MED ORDER — LEVETIRACETAM IN NACL 500 MG/100ML IV SOLN
500.0000 mg | Freq: Two times a day (BID) | INTRAVENOUS | Status: DC
  Administered 2022-11-03 – 2022-11-04 (×2): 500 mg via INTRAVENOUS
  Filled 2022-11-03 (×2): qty 100

## 2022-11-03 MED ORDER — ORAL CARE MOUTH RINSE
15.0000 mL | OROMUCOSAL | Status: DC
  Administered 2022-11-04 – 2022-11-07 (×25): 15 mL via OROMUCOSAL

## 2022-11-03 MED ORDER — GADOBUTROL 1 MMOL/ML IV SOLN
7.0000 mL | Freq: Once | INTRAVENOUS | Status: AC | PRN
  Administered 2022-11-03: 7 mL via INTRAVENOUS
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
## 2022-11-03 NOTE — ED Notes (Signed)
Pt's family is requesting a call when pt arrives to the unit, Contact number updated to call sister [name].

## 2022-11-03 NOTE — Progress Notes (Signed)
LTM EEG hooked up and running - no initial skin breakdown - push button tested - [facility] monitoring.  

## 2022-11-03 NOTE — Progress Notes (Signed)
Pt in MRI at this time 
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
## 2022-11-03 NOTE — ED Notes (Signed)
Patient transported to MRI 
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
## 2022-11-03 NOTE — ED Notes (Signed)
Pt repositioned in bed with additional padding added to this head for comfort

## 2022-11-04 DIAGNOSIS — R569 Unspecified convulsions: Secondary | ICD-10-CM | POA: Diagnosis not present

## 2022-11-04 LAB — URINALYSIS, ROUTINE W REFLEX MICROSCOPIC
Bilirubin Urine: NEGATIVE
Glucose, UA: NEGATIVE mg/dL
Hgb urine dipstick: NEGATIVE
Ketones, ur: 5 mg/dL — AB
Leukocytes,Ua: NEGATIVE
Nitrite: NEGATIVE
Protein, ur: NEGATIVE mg/dL
Specific Gravity, Urine: 1.003 — ABNORMAL LOW (ref 1.005–1.030)
pH: 7 (ref 5.0–8.0)

## 2022-11-04 LAB — CBC
HCT: 42.3 % (ref 39.0–52.0)
Hemoglobin: 13.6 g/dL (ref 13.0–17.0)
MCH: 28.9 pg (ref 26.0–34.0)
MCHC: 32.2 g/dL (ref 30.0–36.0)
MCV: 89.8 fL (ref 80.0–100.0)
Platelets: 254 10*3/uL (ref 150–400)
RBC: 4.71 MIL/uL (ref 4.22–5.81)
RDW: 14.5 % (ref 11.5–15.5)
WBC: 8.3 10*3/uL (ref 4.0–10.5)
nRBC: 0 % (ref 0.0–0.2)

## 2022-11-04 LAB — BASIC METABOLIC PANEL
Anion gap: 13 (ref 5–15)
BUN: 6 mg/dL (ref 6–20)
CO2: 26 mmol/L (ref 22–32)
Calcium: 9.2 mg/dL (ref 8.9–10.3)
Chloride: 101 mmol/L (ref 98–111)
Creatinine, Ser: 0.97 mg/dL (ref 0.61–1.24)
GFR, Estimated: >60 mL/min (ref >60)
Glucose, Bld: 86 mg/dL (ref 70–99)
Potassium: 3.7 mmol/L (ref 3.5–5.1)
Sodium: 140 mmol/L (ref 135–145)

## 2022-11-04 LAB — CK
Total CK: 877 U/L — ABNORMAL HIGH (ref 49–397)
Total CK: 972 U/L — ABNORMAL HIGH (ref 49–397)

## 2022-11-04 LAB — LEVETIRACETAM LEVEL: Levetiracetam Lvl: 15.7 ug/mL (ref 10.0–40.0)

## 2022-11-04 MED ORDER — LEVETIRACETAM IN NACL 1000 MG/100ML IV SOLN
1000.0000 mg | Freq: Two times a day (BID) | INTRAVENOUS | Status: DC
  Administered 2022-11-04 – 2022-11-07 (×6): 1000 mg via INTRAVENOUS
  Filled 2022-11-04 (×9): qty 100

## 2022-11-04 MED ORDER — ABACAVIR-DOLUTEGRAVIR-LAMIVUD 600-50-300 MG PO TABS
1.0000 | ORAL_TABLET | Freq: Every morning | ORAL | Status: DC
  Administered 2022-11-04 – 2022-11-07 (×4): 1 via ORAL
  Filled 2022-11-04 (×4): qty 1

## 2022-11-04 MED ORDER — LACTATED RINGERS IV BOLUS
1000.0000 mL | Freq: Once | INTRAVENOUS | Status: AC
  Administered 2022-11-04: 1000 mL via INTRAVENOUS

## 2022-11-04 MED ORDER — ASPIRIN 81 MG PO TBEC
81.0000 mg | DELAYED_RELEASE_TABLET | Freq: Every day | ORAL | Status: DC
  Administered 2022-11-04 – 2022-11-07 (×4): 81 mg via ORAL
  Filled 2022-11-04 (×4): qty 1

## 2022-11-04 MED ORDER — SODIUM CHLORIDE 0.9 % IV SOLN
20.0000 mg/kg | Freq: Once | INTRAVENOUS | Status: AC
  Administered 2022-11-04: 1452.1 mg via INTRAVENOUS
  Filled 2022-11-04: qty 11.17

## 2022-11-04 MED ORDER — DOLUTEGRAVIR SODIUM 50 MG PO TABS
50.0000 mg | ORAL_TABLET | Freq: Every evening | ORAL | Status: DC
  Administered 2022-11-04 – 2022-11-06 (×3): 50 mg via ORAL
  Filled 2022-11-04 (×4): qty 1

## 2022-11-04 MED ORDER — SODIUM CHLORIDE 0.9 % IV SOLN
200.0000 mg | Freq: Once | INTRAVENOUS | Status: AC
  Administered 2022-11-04: 200 mg via INTRAVENOUS
  Filled 2022-11-04: qty 20

## 2022-11-04 MED ORDER — SODIUM CHLORIDE 0.9 % IV SOLN
20.0000 mg/kg | Freq: Once | INTRAVENOUS | Status: DC
  Filled 2022-11-04: qty 11.17

## 2022-11-04 MED ORDER — DOLUTEGRAVIR SODIUM 50 MG PO TABS
50.0000 mg | ORAL_TABLET | Freq: Every evening | ORAL | Status: DC
  Filled 2022-11-04: qty 1

## 2022-11-04 MED ORDER — LACTATED RINGERS IV SOLN: INTRAVENOUS | Status: AC

## 2022-11-04 MED ORDER — DOLUTEGRAVIR SODIUM 50 MG PO TABS: 50.0000 mg | ORAL_TABLET | Freq: Every evening | ORAL | Status: DC

## 2022-11-04 MED ORDER — LEVETIRACETAM IN NACL 1000 MG/100ML IV SOLN
1000.0000 mg | INTRAVENOUS | Status: AC
  Administered 2022-11-04: 1000 mg via INTRAVENOUS
  Filled 2022-11-04: qty 100

## 2022-11-04 NOTE — Progress Notes (Signed)
Maintenance complete, all impeds uner 10 no skin breakdown at all skin sites.

## 2022-11-04 NOTE — Progress Notes (Signed)
Mid day progress note EEG shows continuous electrographic seizure activity Recommend loading with phenobarbital 20 mg/kg IV x1. If he continues to seize, will add perampanel as the next step. Ativan as needed for any seizure lasting more than 5 minutes. Surprisingly most of his seizures have been subclinical and the episodes that he has described as seizures a lot of times have not had EEG correlates but that said he has had another frequency of seizures to continue to monitor him on LTM EEG. Plan was discussed with the primary team and the patient's bedside RN on secure chat  -- Amie Portland, MD Neurologist [facility] Pager: [phone]
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
## 2022-11-04 NOTE — Plan of Care (Signed)

## 2022-11-04 NOTE — ED Notes (Signed)
This paramedic and RN Jinny Blossom were called into room 26 by [facility] staff. Patient was found to be prone on the bed and hanging in a manner to which his head was almost on the floor from the top of the bed. Patient did not respond to staff as we entered room. Patient was stiff and unable to be moved back into the bed by this paramedic and RN.  Emergency button pushed by RN megan in the room for more assistance with the patient. More staff entered the room promptly and patient was moved back on the bed and turned supine. Patient IV was being pulled and his condom cath was off. Patient was evaluated by ED Dr Maryan Rued who came to the emergency alarm. Patient became more responsive and stated he was trying to get his urinal. Patient IV was re-taped and secured with coband at this time.

## 2022-11-05 DIAGNOSIS — R569 Unspecified convulsions: Secondary | ICD-10-CM | POA: Diagnosis not present

## 2022-11-05 MED ORDER — MELATONIN 3 MG PO TABS
3.0000 mg | ORAL_TABLET | Freq: Every day | ORAL | Status: DC
  Administered 2022-11-05: 3 mg via ORAL
  Filled 2022-11-05: qty 1

## 2022-11-05 MED ORDER — HYDROCHLOROTHIAZIDE 25 MG PO TABS
50.0000 mg | ORAL_TABLET | Freq: Every day | ORAL | Status: DC
  Administered 2022-11-05 – 2022-11-07 (×3): 50 mg via ORAL
  Filled 2022-11-05 (×3): qty 2
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
## 2022-11-05 NOTE — TOC Initial Note (Signed)
Transition of Care [facility]) - Initial/Assessment Note    Patient Details  Name: Tristan [name] MRN: [id number] Date of Birth: 08-20-1961  Transition of Care [facility]) CM/SW Contact:    Cyndi Bender, RN Phone Number: 11/05/2022, 12:48 PM  Clinical Narrative:                  Spoke to sister, [name], regarding transition needs.  [name] states patient lives by himself but will go to other sister's , [name], home at discharge.  [name] agreeable for TOC to make PCP apt. This RNCM sent request to CMA to make apt.   [name] states she does not know why patient missed the Neurology apts but that family would make sure he goes to apts now.  [name] would like PT eval, MD notified.  TOC will continue to follow for needs.  Barriers to Discharge: Continued Medical Work up   Patient Goals and CMS Choice Patient states their goals for this hospitalization and ongoing recovery are:: going home with sister      Expected Discharge Plan and Services   In-house Referral: PCP / [facility] Discharge Planning Services: Follow-up appt scheduled   Living arrangements for the past 2 months: Apartment                                      Prior Living Arrangements/Services Living arrangements for the past 2 months: Apartment Lives with:: Self Patient language and need for interpreter reviewed:: Yes        Need for Family Participation in Patient Care: Yes (Comment) Care giver support system in place?: Yes (comment) Current home services: DME (cane) Criminal Activity/Legal Involvement Pertinent to Current Situation/Hospitalization: No - Comment as needed  Activities of Daily Living      Permission Sought/Granted                  Emotional Assessment       Orientation: : Oriented to Self Alcohol / Substance Use: Not Applicable Psych Involvement: No (comment)  Admission diagnosis:  Seizure ([place]) [R56.9] Patient Active Problem List   Diagnosis Date Noted   Seizure  ([address]) 11/03/2022   Weight loss 11/12/2021   Latent tuberculosis by blood test 06/05/2016   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   Gait disorder 04/05/2015   Human immunodeficiency virus (HIV) disease ([place]) 02/05/2007   HERPES ZOSTER, UNCOMPLICATED 56/97/9480   HYPERLIPIDEMIA 02/05/2007   ABUSE, ALCOHOL, UNSPECIFIED 02/05/2007   DISORDER, TOBACCO USE 02/05/2007   HEMIPARESIS 02/05/2007   Essential hypertension 02/05/2007   PCP:  Merryl Hacker, No Pharmacy:   [facility] [place] ([place] - [address] [place] [address] Phone: [phone] Fax: [facility] [place], [place] [address] Phone: [phone] Fax: [phone]     Social Determinants of Health (SDOH) Interventions    Readmission Risk Interventions     No data to display
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
## 2022-11-05 NOTE — Progress Notes (Signed)
Maintained Fz , Fp2, O2, and T8. All under 10

## 2022-11-05 NOTE — Progress Notes (Signed)
Patient admitted for seizure activities. Patient was lethargic at beginning of shift opens eyes to voice but uable to sustain wakefulness.  All Patient received all IV medications but could not be alert enough to receive po medications. Patient woke up with aggression and refused all his po medications was irritable and yelling on nurse to get out of room. Dr. Johnney Ou was notified All antiviral medications are tablets was charted not given.

## 2022-11-05 NOTE — Plan of Care (Signed)
  Problem: Education: Goal: Expressions of having a comfortable level of knowledge regarding the disease process will increase Outcome: Progressing   Problem: Coping: Goal: Ability to adjust to condition or change in health will improve Outcome: Progressing Goal: Ability to identify appropriate support needs will improve Outcome: Progressing   Problem: Medication: Goal: Risk for medication side effects will decrease Outcome: Progressing   Problem: Clinical Measurements: Goal: Complications related to the disease process, condition or treatment will be avoided or minimized Outcome: Progressing Goal: Diagnostic test results will improve Outcome: Progressing   Problem: Safety: Goal: Verbalization of understanding the information provided will improve Outcome: Progressing
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
## 2022-11-06 LAB — CBC
HCT: 41.8 % (ref 39.0–52.0)
Hemoglobin: 14.2 g/dL (ref 13.0–17.0)
MCH: 29.5 pg (ref 26.0–34.0)
MCHC: 34 g/dL (ref 30.0–36.0)
MCV: 86.9 fL (ref 80.0–100.0)
Platelets: 227 10*3/uL (ref 150–400)
RBC: 4.81 MIL/uL (ref 4.22–5.81)
RDW: 14.2 % (ref 11.5–15.5)
WBC: 10.6 10*3/uL — ABNORMAL HIGH (ref 4.0–10.5)
nRBC: 0 % (ref 0.0–0.2)

## 2022-11-06 LAB — BASIC METABOLIC PANEL
Anion gap: 9 (ref 5–15)
BUN: 7 mg/dL (ref 6–20)
CO2: 25 mmol/L (ref 22–32)
Calcium: 8.8 mg/dL — ABNORMAL LOW (ref 8.9–10.3)
Chloride: 100 mmol/L (ref 98–111)
Creatinine, Ser: 1.09 mg/dL (ref 0.61–1.24)
GFR, Estimated: >60 mL/min (ref >60)
Glucose, Bld: 102 mg/dL — ABNORMAL HIGH (ref 70–99)
Potassium: 3.4 mmol/L — ABNORMAL LOW (ref 3.5–5.1)
Sodium: 134 mmol/L — ABNORMAL LOW (ref 135–145)

## 2022-11-06 MED ORDER — POLYETHYLENE GLYCOL 3350 17 G PO PACK: 17.0000 g | PACK | Freq: Every day | ORAL | Status: DC

## 2022-11-06 MED ORDER — POTASSIUM CHLORIDE CRYS ER 20 MEQ PO TBCR
40.0000 meq | EXTENDED_RELEASE_TABLET | Freq: Two times a day (BID) | ORAL | Status: AC
  Administered 2022-11-06 (×2): 40 meq via ORAL
  Filled 2022-11-06 (×2): qty 2

## 2022-11-06 MED ORDER — DOCUSATE SODIUM 100 MG PO CAPS
100.0000 mg | ORAL_CAPSULE | Freq: Every day | ORAL | Status: DC
  Administered 2022-11-06 – 2022-11-07 (×2): 100 mg via ORAL
  Filled 2022-11-06 (×2): qty 1
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
## 2022-11-06 NOTE — Care Management Important Message (Signed)
Important Message  Patient Details  Name: Tristan [name] MRN: [id number] Date of Birth: 02-Mar-1961   Medicare Important Message Given:  Yes     [name] 11/06/2022, 2:26 PM
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
## 2022-11-06 NOTE — Progress Notes (Signed)
LTM EEG discontinued - no skin breakdown at unhook.   

## 2022-11-06 NOTE — Plan of Care (Signed)
  Problem: Health Behavior/Discharge Planning: Goal: Ability to manage health-related needs will improve Outcome: Progressing   Problem: Clinical Measurements: Goal: Ability to maintain clinical measurements within normal limits will improve Outcome: Progressing   Problem: Education: Goal: Expressions of having a comfortable level of knowledge regarding the disease process will increase Outcome: Progressing   Problem: Coping: Goal: Ability to adjust to condition or change in health will improve Outcome: Progressing Goal: Ability to identify appropriate support needs will improve Outcome: Progressing   Problem: Coping: Goal: Ability to identify appropriate support needs will improve Outcome: Progressing   Problem: Health Behavior/Discharge Planning: Goal: Compliance with prescribed medication regimen will improve Outcome: Progressing

## 2022-11-06 NOTE — Progress Notes (Signed)
MB performed maintenance on F7, FP1, P8, Pz electrodes. All impedances are below 10k ohms. No skin breakdown noted. Mb placed stretch net wrap on head
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
## 2022-11-07 ENCOUNTER — Other Ambulatory Visit (HOSPITAL_COMMUNITY): Payer: Self-pay

## 2022-11-07 LAB — BASIC METABOLIC PANEL
Anion gap: 10 (ref 5–15)
BUN: 9 mg/dL (ref 6–20)
CO2: 28 mmol/L (ref 22–32)
Calcium: 8.9 mg/dL (ref 8.9–10.3)
Chloride: 101 mmol/L (ref 98–111)
Creatinine, Ser: 1.15 mg/dL (ref 0.61–1.24)
GFR, Estimated: >60 mL/min (ref >60)
Glucose, Bld: 96 mg/dL (ref 70–99)
Potassium: 3.4 mmol/L — ABNORMAL LOW (ref 3.5–5.1)
Sodium: 139 mmol/L (ref 135–145)

## 2022-11-07 MED ORDER — PHENYTOIN SODIUM EXTENDED 100 MG PO CAPS
100.0000 mg | ORAL_CAPSULE | Freq: Three times a day (TID) | ORAL | 2 refills | Status: AC
  Filled 2022-11-07: qty 90, 30d supply, fill #0

## 2022-11-07 MED ORDER — LEVETIRACETAM 500 MG PO TABS
1000.0000 mg | ORAL_TABLET | Freq: Two times a day (BID) | ORAL | 2 refills | Status: AC
  Filled 2022-11-07: qty 180, 45d supply, fill #0

## 2022-11-07 MED ORDER — POTASSIUM CHLORIDE CRYS ER 20 MEQ PO TBCR
40.0000 meq | EXTENDED_RELEASE_TABLET | Freq: Two times a day (BID) | ORAL | Status: DC
  Administered 2022-11-07: 40 meq via ORAL
  Filled 2022-11-07: qty 2
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
## 2022-11-07 NOTE — Progress Notes (Signed)
   Durable Medical Equipment (From admission, onward)        Start     Ordered  11/07/22 1304  DME standard manual wheelchair with seat cushion  Once      Comments: Patient suffers from history of traumatic brain injury which impairs their ability to perform daily activities like bathing, dressing, and toileting in the home.  A cane, crutch, or walker will not resolve issue with performing activities of daily living. A wheelchair will allow patient to safely perform daily activities. Patient can safely propel the wheelchair in the home or has a caregiver who can provide assistance. Length of need Lifetime. Accessories: elevating leg rests (ELRs), wheel locks, extensions and anti-tippers.  11/07/22 1309

## 2022-11-07 NOTE — TOC Transition Note (Addendum)
Transition of Care [facility]) - CM/SW Discharge Note   Patient Details  Name: Tristan [name] MRN: [id number] Date of Birth: [birth year]  [facility] CM/SW Contact:  Cyndi Bender, RN Phone Number: 11/07/2022, 1:45 PM   Clinical Narrative:     Patient is stable for discharge.  Patient will be staying with his sister, [name].  Spoke to sister, [name], regarding transition needs.  Choice offered for Home health. [name] deferred to [facility] to find highly rated agency. Tommi Rumps with [facility] accepted referral. [name] is agreeable to use in house provider [facility],for DME needs.  [place] with [facility] notified of wheelchair order. WC will be delivered to the room prior to discharge.  Sister will transport patient home.  No other TOC needs.   Sarah's address: [address] [place], [place]  Final next level of care: [place] Barriers to Discharge: Barriers Resolved   Patient Goals and CMS Choice Patient states their goals for this hospitalization and ongoing recovery are:: going home with sister CMS Medicare.gov Compare Post Acute Care list provided to:: Patient Represenative (must comment) Choice offered to / list presented to : Sibling  Discharge Placement                       Discharge Plan and Services In-house Referral: PCP / [facility] Discharge Planning Services: Follow-up appt scheduled Post Acute Care Choice: Home Health, Durable Medical Equipment          DME Arranged: Wheelchair manual DME Agency: [facility]       HH Arranged: PT HH Agency: [place] Date [facility] Agency Contacted: 11/07/22 Time [place]: [id number] Representative spoke with at [place]: [place] ([place]) Interventions     Readmission Risk Interventions    11/07/2022    1:45 PM  Readmission Risk Prevention Plan  Post Dischage Appt Complete  Medication Screening Complete  Transportation Screening Complete
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
## 2022-11-07 NOTE — Plan of Care (Signed)
Brief note  Patient briefly seen this morning.  No new complaints.  No report of clinical seizures.  Okay to discharge on Keppra 1000 twice daily and phenytoin 100 mg 3 times daily.  Outpatient follow-up-has been previously dismissed presumably from [facility] practice.  Can try to refer to [facility] neurology.  Inpatient neurology will be loaded with questions.  Plan was discussed with the internal medicine team  -- Amie Portland, MD Neurologist [facility] Pager: [phone]

## 2022-11-07 NOTE — Plan of Care (Signed)
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
## 2022-11-11 ENCOUNTER — Encounter: Payer: Medicare Other | Admitting: Internal Medicine

## 2022-11-20 ENCOUNTER — Ambulatory Visit: Payer: Medicare Other | Admitting: Student

## 2022-11-20 DIAGNOSIS — S069XAA Unspecified intracranial injury with loss of consciousness status unknown, initial encounter: Secondary | ICD-10-CM | POA: Insufficient documentation

## 2022-11-25 ENCOUNTER — Encounter: Payer: Self-pay | Admitting: Internal Medicine

## 2022-11-25 ENCOUNTER — Other Ambulatory Visit: Payer: Self-pay

## 2022-11-25 ENCOUNTER — Ambulatory Visit (INDEPENDENT_AMBULATORY_CARE_PROVIDER_SITE_OTHER): Payer: Medicare Other | Admitting: Internal Medicine

## 2022-11-25 VITALS — BP 143/89 | HR 73 | Temp 97.8°F

## 2022-11-25 DIAGNOSIS — B20 Human immunodeficiency virus [HIV] disease: Secondary | ICD-10-CM

## 2022-11-25 DIAGNOSIS — S069X9D Unspecified intracranial injury with loss of consciousness of unspecified duration, subsequent encounter: Secondary | ICD-10-CM | POA: Diagnosis not present

## 2022-11-25 MED ORDER — TRIUMEQ 600-50-300 MG PO TABS: 1.0000 | ORAL_TABLET | Freq: Every day | ORAL | 11 refills | Status: DC

## 2022-11-25 MED ORDER — TIVICAY 50 MG PO TABS: 50.0000 mg | ORAL_TABLET | Freq: Every day | ORAL | 11 refills | Status: DC

## 2022-11-25 NOTE — Assessment & Plan Note (Signed)
His HIV infection is under excellent, long-term control.  He will continue Triumeq and Tivicay and follow-up after lab work in 1 year.
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
## 2023-01-06 ENCOUNTER — Telehealth: Payer: Self-pay | Admitting: *Deleted

## 2023-01-06 NOTE — Telephone Encounter (Signed)
Noted  

## 2023-01-06 NOTE — Telephone Encounter (Signed)
The patient was deemed to have seizure disorder controlled on meds when seen by neuro in June 2023 and then was admitted with breakthrough seizures in Nov 2023 New med started (phenytoin) to existing Keppra Apparently he was dismissed from [employer] neuro, unclear why He has followed with ID as recommended If no further seizures likely ok from [facility], but will loop in Osvaldo Angst with anesthesia to ensure he is in agreement. The pt should notify us immediately in the event he has another seizure prior to previsit appointment or if prior to [facility] procedure

## 2023-01-06 NOTE — Telephone Encounter (Signed)
Dr.  Hilarie Fredrickson,  This pt is cleared for anesthetic care at [facility].  Thanks,  Osvaldo Angst

## 2023-01-06 NOTE — Telephone Encounter (Signed)
Pt. Was admitted to hospital in [place] 02/09/23,PLEASE REVIEW CHART AND ADVISE?

## 2023-01-19 ENCOUNTER — Ambulatory Visit: Payer: Self-pay | Admitting: Nurse Practitioner

## 2023-01-20 ENCOUNTER — Ambulatory Visit (AMBULATORY_SURGERY_CENTER): Payer: Medicaid Other

## 2023-01-20 VITALS — Ht 75.0 in | Wt 165.0 lb

## 2023-01-20 DIAGNOSIS — Z800 Family history of malignant neoplasm of digestive organs: Secondary | ICD-10-CM

## 2023-01-20 MED ORDER — PEG 3350-KCL-NA BICARB-NACL 420 G PO SOLR: 4000.0000 mL | Freq: Once | ORAL | 0 refills | Status: AC

## 2023-01-20 NOTE — Progress Notes (Signed)

## 2023-02-09 ENCOUNTER — Ambulatory Visit (AMBULATORY_SURGERY_CENTER): Payer: 59 | Admitting: Internal Medicine

## 2023-02-09 ENCOUNTER — Encounter: Payer: Self-pay | Admitting: Internal Medicine

## 2023-02-09 VITALS — BP 154/86 | HR 74 | Temp 98.0°F | Resp 15 | Ht 75.0 in | Wt 165.0 lb

## 2023-02-09 DIAGNOSIS — D123 Benign neoplasm of transverse colon: Secondary | ICD-10-CM | POA: Diagnosis not present

## 2023-02-09 DIAGNOSIS — Z800 Family history of malignant neoplasm of digestive organs: Secondary | ICD-10-CM | POA: Diagnosis not present

## 2023-02-09 DIAGNOSIS — D122 Benign neoplasm of ascending colon: Secondary | ICD-10-CM | POA: Diagnosis not present

## 2023-02-09 DIAGNOSIS — Z1211 Encounter for screening for malignant neoplasm of colon: Secondary | ICD-10-CM | POA: Diagnosis not present

## 2023-02-09 MED ORDER — SODIUM CHLORIDE 0.9 % IV SOLN: 500.0000 mL | Freq: Once | INTRAVENOUS | Status: DC

## 2023-02-09 NOTE — Progress Notes (Signed)
VS by DT  Pt's states no medical or surgical changes since previsit or office visit.  

## 2023-02-09 NOTE — Patient Instructions (Signed)
Thank you for coming to see [place] today! Resume diet and medications today. Return to regular daily activities tomorrow. Polyp biopsy results will be available in about one week.  Recommendation will be made at that time for future colonoscopy.     YOU HAD AN ENDOSCOPIC PROCEDURE TODAY AT [facility]:   Refer to the procedure report that was given to you for any specific questions about what was found during the examination.  If the procedure report does not answer your questions, please call your gastroenterologist to clarify.  If you requested that your care partner not be given the details of your procedure findings, then the procedure report has been included in a sealed envelope for you to review at your convenience later.  YOU SHOULD EXPECT: Some feelings of bloating in the abdomen. Passage of more gas than usual.  Walking can help get rid of the air that was put into your GI tract during the procedure and reduce the bloating. If you had a lower endoscopy (such as a colonoscopy or flexible sigmoidoscopy) you may notice spotting of blood in your stool or on the toilet paper. If you underwent a bowel prep for your procedure, you may not have a normal bowel movement for a few days.  Please Note:  You might notice some irritation and congestion in your nose or some drainage.  This is from the oxygen used during your procedure.  There is no need for concern and it should clear up in a day or so.  SYMPTOMS TO REPORT IMMEDIATELY:  Following lower endoscopy (colonoscopy or flexible sigmoidoscopy):  Excessive amounts of blood in the stool  Significant tenderness or worsening of abdominal pains  Swelling of the abdomen that is new, acute  Fever of 100F or higher   For urgent or emergent issues, a gastroenterologist can be reached at any hour by calling [phone]. Do not use MyChart messaging for urgent concerns.    DIET:  We do recommend a small meal at first, but then you  may proceed to your regular diet.  Drink plenty of fluids but you should avoid alcoholic beverages for 24 hours.  ACTIVITY:  You should plan to take it easy for the rest of today and you should NOT DRIVE or use heavy machinery until tomorrow (because of the sedation medicines used during the test).    FOLLOW UP: Our staff will call the number listed on your records the next business day following your procedure.  We will call around 7:15- 8:00 am to check on you and address any questions or concerns that you may have regarding the information given to you following your procedure. If we do not reach you, we will leave a message.     If any biopsies were taken you will be contacted by phone or by letter within the next 1-3 weeks.  Please call us at [phone] if you have not heard about the biopsies in 3 weeks.    SIGNATURES/CONFIDENTIALITY: You and/or your care partner have signed paperwork which will be entered into your electronic medical record.  These signatures attest to the fact that that the information above on your After Visit Summary has been reviewed and is understood.  Full responsibility of the confidentiality of this discharge information lies with you and/or your care-partner.

## 2023-02-09 NOTE — Progress Notes (Signed)
Called to room to assist during endoscopic procedure.  Patient ID and intended procedure confirmed with present staff. Received instructions for my participation in the procedure from the performing physician.  
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
## 2023-02-09 NOTE — Progress Notes (Signed)
Report to pacu rn. Vss. Care resumed by rn. 

## 2023-02-09 NOTE — Op Note (Signed)
[place] Patient Name: Tristan [name] Procedure Date: 02/09/2023 11:04 AM MRN: [id number] Endoscopist: Jerene Bears , MD, [id number] Age: 62 Referring MD:  Date of Birth: 04-12-1961 Gender: Male Account #: [id number] Procedure:                Colonoscopy Indications:              Screening in patient at increased risk: Family                            history of 1st-degree relative (mother) with                            colorectal cancer before age 84 years, Last                            colonoscopy: July 2014 Medicines:                Monitored Anesthesia Care Procedure:                Pre-Anesthesia Assessment:                           - Prior to the procedure, a History and Physical                            was performed, and patient medications and                            allergies were reviewed. The patient's tolerance of                            previous anesthesia was also reviewed. The risks                            and benefits of the procedure and the sedation                            options and risks were discussed with the patient.                            All questions were answered, and informed consent                            was obtained. Prior Anticoagulants: The patient has                            taken no anticoagulant or antiplatelet agents. ASA                            Grade Assessment: III - A patient with severe                            systemic disease. After reviewing the risks and  benefits, the patient was deemed in satisfactory                            condition to undergo the procedure.                           After obtaining informed consent, the colonoscope                            was passed under direct vision. Throughout the                            procedure, the patient's blood pressure, pulse, and                            oxygen saturations were monitored continuously.  The                            Olympus SN [id number] was introduced through the anus                            and advanced to the cecum, identified by the                            appendiceal orifice. The colonoscopy was performed                            without difficulty. The patient tolerated the                            procedure well. The quality of the bowel                            preparation was good. The ileocecal valve,                            appendiceal orifice, and rectum were photographed. Scope In: 11:12:04 AM Scope Out: 11:26:18 AM Scope Withdrawal Time: 0 hours 11 minutes 7 seconds  Total Procedure Duration: 0 hours 14 minutes 14 seconds  Findings:                 The digital rectal exam was normal.                           Two sessile polyps were found in the ascending                            colon. The polyps were 4 to 6 mm in size. These                            polyps were removed with a cold snare. Resection                            and retrieval were complete.  Two sessile polyps were found in the transverse                            colon. The polyps were 5 to 7 mm in size. These                            polyps were removed with a cold snare. Resection                            and retrieval were complete.                           Multiple medium-mouthed and small-mouthed                            diverticula were found in the transverse colon,                            hepatic flexure and distal ascending colon.                           Internal hemorrhoids were found during                            retroflexion. The hemorrhoids were small. Complications:            No immediate complications. Estimated Blood Loss:     Estimated blood loss: none. Impression:               - Two 4 to 6 mm polyps in the ascending colon,                            removed with a cold snare. Resected and retrieved.                            - Two 5 to 7 mm polyps in the transverse colon,                            removed with a cold snare. Resected and retrieved.                           - Mild diverticulosis in the transverse colon, at                            the hepatic flexure and in the distal ascending                            colon.                           - Internal hemorrhoids. Recommendation:           - Patient has a contact number available for  emergencies. The signs and symptoms of potential                            delayed complications were discussed with the                            patient. Return to normal activities tomorrow.                            Written discharge instructions were provided to the                            patient.                           - Resume previous diet.                           - Continue present medications.                           - Await pathology results.                           - Repeat colonoscopy is recommended for                            surveillance. The colonoscopy date will be                            determined after pathology results from today's                            exam become available for review. Jerene Bears, MD 02/09/2023 11:29:55 AM This report has been signed electronically.

## 2023-02-10 ENCOUNTER — Telehealth: Payer: Self-pay

## 2023-02-10 NOTE — Telephone Encounter (Signed)
Left message on answering machine. 

## 2023-02-12 ENCOUNTER — Encounter: Payer: Self-pay | Admitting: Internal Medicine

## 2023-11-24 ENCOUNTER — Other Ambulatory Visit: Payer: Self-pay

## 2023-11-24 DIAGNOSIS — Z113 Encounter for screening for infections with a predominantly sexual mode of transmission: Secondary | ICD-10-CM

## 2023-11-24 DIAGNOSIS — B20 Human immunodeficiency virus [HIV] disease: Secondary | ICD-10-CM

## 2023-11-25 ENCOUNTER — Other Ambulatory Visit: Payer: 59

## 2023-11-26 ENCOUNTER — Other Ambulatory Visit: Payer: Self-pay

## 2023-11-26 ENCOUNTER — Ambulatory Visit: Payer: 59 | Admitting: Internal Medicine

## 2023-11-26 ENCOUNTER — Other Ambulatory Visit: Payer: 59

## 2023-11-26 DIAGNOSIS — B20 Human immunodeficiency virus [HIV] disease: Secondary | ICD-10-CM

## 2023-11-26 DIAGNOSIS — Z113 Encounter for screening for infections with a predominantly sexual mode of transmission: Secondary | ICD-10-CM

## 2023-11-28 LAB — T-HELPER CELLS (CD4) COUNT (NOT AT ARMC)
Absolute CD4: 1998 {cells}/uL — ABNORMAL HIGH (ref 490–1740)
CD4 T Helper %: 49 % (ref 30–61)
Total lymphocyte count: 4046 {cells}/uL — ABNORMAL HIGH (ref 850–3900)

## 2023-11-28 LAB — COMPLETE METABOLIC PANEL WITH GFR
AG Ratio: 1.5 (calc) (ref 1.0–2.5)
ALT: 36 U/L (ref 9–46)
AST: 24 U/L (ref 10–35)
Albumin: 4.3 g/dL (ref 3.6–5.1)
Alkaline phosphatase (APISO): 55 U/L (ref 35–144)
BUN: 13 mg/dL (ref 7–25)
CO2: 27 mmol/L (ref 20–32)
Calcium: 9.7 mg/dL (ref 8.6–10.3)
Chloride: 103 mmol/L (ref 98–110)
Creat: 0.73 mg/dL (ref 0.70–1.35)
Globulin: 2.9 g/dL (ref 1.9–3.7)
Glucose, Bld: 70 mg/dL (ref 65–99)
Potassium: 4.3 mmol/L (ref 3.5–5.3)
Sodium: 139 mmol/L (ref 135–146)
Total Bilirubin: 0.2 mg/dL (ref 0.2–1.2)
Total Protein: 7.2 g/dL (ref 6.1–8.1)
eGFR: 104 mL/min/{1.73_m2} (ref >=60)

## 2023-11-28 LAB — CBC WITH DIFFERENTIAL/PLATELET
Absolute Lymphocytes: 3804 {cells}/uL (ref 850–3900)
Absolute Monocytes: 616 {cells}/uL (ref 200–950)
Basophils Absolute: 39 {cells}/uL (ref 0–200)
Basophils Relative: 0.5 %
Eosinophils Absolute: 54 {cells}/uL (ref 15–500)
Eosinophils Relative: 0.7 %
HCT: 36.5 % — ABNORMAL LOW (ref 38.5–50.0)
Hemoglobin: 12 g/dL — ABNORMAL LOW (ref 13.2–17.1)
MCH: 28.9 pg (ref 27.0–33.0)
MCHC: 32.9 g/dL (ref 32.0–36.0)
MCV: 88 fL (ref 80.0–100.0)
MPV: 11.6 fL (ref 7.5–12.5)
Monocytes Relative: 8 %
Neutro Abs: 3188 {cells}/uL (ref 1500–7800)
Neutrophils Relative %: 41.4 %
Platelets: 207 10*3/uL (ref 140–400)
RBC: 4.15 10*6/uL — ABNORMAL LOW (ref 4.20–5.80)
RDW: 13.6 % (ref 11.0–15.0)
Total Lymphocyte: 49.4 %
WBC: 7.7 10*3/uL (ref 3.8–10.8)

## 2023-11-28 LAB — LIPID PANEL
Cholesterol: 149 mg/dL (ref <200)
HDL: 47 mg/dL (ref >=40)
LDL Cholesterol (Calc): 72 mg/dL
Non-HDL Cholesterol (Calc): 102 mg/dL (ref <130)
Total CHOL/HDL Ratio: 3.2 (calc) (ref <5.0)
Triglycerides: 207 mg/dL — ABNORMAL HIGH (ref <150)

## 2023-11-28 LAB — HIV-1 RNA QUANT-NO REFLEX-BLD
HIV 1 RNA Quant: NOT DETECTED {copies}/mL
HIV-1 RNA Quant, Log: NOT DETECTED {Log_copies}/mL

## 2023-11-28 LAB — RPR: RPR Ser Ql: NONREACTIVE

## 2023-12-02 ENCOUNTER — Ambulatory Visit: Payer: 59 | Admitting: Internal Medicine

## 2023-12-03 ENCOUNTER — Ambulatory Visit: Payer: 59 | Admitting: Internal Medicine

## 2023-12-03 ENCOUNTER — Other Ambulatory Visit: Payer: Self-pay

## 2023-12-03 VITALS — BP 154/73 | Temp 97.3°F

## 2023-12-03 DIAGNOSIS — B20 Human immunodeficiency virus [HIV] disease: Secondary | ICD-10-CM | POA: Diagnosis not present

## 2023-12-03 DIAGNOSIS — Z23 Encounter for immunization: Secondary | ICD-10-CM | POA: Diagnosis not present

## 2023-12-03 DIAGNOSIS — Z113 Encounter for screening for infections with a predominantly sexual mode of transmission: Secondary | ICD-10-CM

## 2023-12-03 NOTE — Progress Notes (Addendum)
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   ASPIRIN EC 81 MG TABLET    Take 81 mg by mouth daily.     DOLUTEGRAVIR (TIVICAY) 50 MG TABLET    Take 1 tablet (50 mg total) by mouth daily.   HYDROCHLOROTHIAZIDE (HYDRODIURIL) 50 MG TABLET    Take 1 tablet by mouth once daily   LEVETIRACETAM (KEPPRA) 500 MG TABLET    Take 2 tablets (1,000 mg total) by mouth 2 (two) times daily.   PHENYTOIN (DILANTIN) 100 MG ER CAPSULE    Take 1 capsule (100 mg total) by mouth 3 (three) times daily.   ROSUVASTATIN (CRESTOR) 40 MG TABLET    Take 40 mg by mouth daily.  Modified Medications   No medications on file  Discontinued Medications   No medications on file    Subjective: [name] is in for his routine HIV follow-up visit.  He denies any problems obtaining, taking or tolerating his Triumeq or Tivicay and has not missed any doses.  He did run out of his Dilantin on September 23.  His neurologist's office sent him a letter stating that they would no longer see him and so he took his Dilantin until it ran out.  He said he was thinking that maybe he did not need to take it anymore.  However in November he started having focal seizures again leading to admission to the hospital.  Dilantin levels were very low and his Dilantin was restarted.  His  seizures came under control and he has not had any further seizure activity since discharge.  He has a new PCP and recently received his influenza vaccine and an updated COVID-vaccine.   12/03/23 [facility] clinic f/u: Dx'ed hiv 1990s   First visit with me today Has been seeing dr Orvan Falconer Reviewed his ART -- due to phenytoin he takes triumeq and additional 12 hour later tivicay His last seizure was about a year ago due to running out of antiepileptics -- he had hx traumatic brain injury as cause for seizure No health concern today He has a primary care in [place] who is practicing  out of [facility]  He has no complaint today  No missed dose tivicay/triumeq the last 4 weeks  Reviewed labs  Patient gets around with cane/electric scooter -- tbi with residual left sided weakness   Social: -live alone -has family living in town -recreation [place] -not working -not sexually active -- defer std screening     Review of Systems: Review of Systems  Neurological:  Positive for seizures.    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   Gait disorder 04/05/2015   GERD 09/28/2007   Qualifier: Diagnosis of  By: Orvan Falconer MD, Chaney Malling 02/05/2007   Annotation: transient, while on HART Qualifier: Diagnosis of  By: Orvan Falconer MD, John     Head injury    10/1990   HIP FRACTURE, LEFT 02/05/2007   Annotation: 1/04, s/p ORIF Qualifier: Diagnosis of  By: Orvan Falconer MD, John     HIV infection [facility])    Hyperlipidemia    Hypertension    LAPAROSCOPIC MYOTOMY AND FUNDOPLICATION 03/05/2010   Annotation:  8/10 Qualifier: Diagnosis of  By: Orvan Falconer MD, John     PNEUMONIA, HX OF 02/05/2007   Annotation: with pneumothorax 1991 Qualifier: Diagnosis of  By: Orvan Falconer MD, John     Seasonal allergies    Seizure (HCC)    Seizures (HCC)    Substance abuse (HCC)    marijuana    Social History   Tobacco Use   Smoking status: Former    Current packs/day: 0.00    Average packs/day: 0.5 packs/day for 23.0 years (11.5 ttl pk-yrs)    Types: Cigarettes    Start date: 06/21/1974    Quit date: 06/21/1997    Years since quitting: 26.4   Smokeless tobacco: Never  Vaping Use   Vaping status: Never Used  Substance Use Topics   Alcohol use: No    Alcohol/week: 0.0 standard drinks of alcohol   Drug use: Yes    Frequency: 4.0 times per week    Types: Marijuana    Comment: last use on 01/18/23,updated 01/20/23    Family History  Problem Relation Age of Onset   Colon cancer Mother 44   Diabetes Mother    Hypertension Father    Stroke Father    Diabetes Sister    Diabetes Sister    Stomach cancer Sister    Colon polyps Neg Hx    Crohn's disease Neg Hx    Esophageal cancer Neg Hx    Rectal cancer Neg Hx    Ulcerative colitis Neg Hx     No Known Allergies  Health Maintenance  Topic Date Due   Medicare Annual Wellness (AWV)  Never done   DTaP/Tdap/Td (1 - Tdap) Never done   Zoster Vaccines- Shingrix (1 of 2) Never done   COVID-19 Vaccine (3 - Mixed Product risk series) 12/21/2022   INFLUENZA VACCINE  07/23/2023   Colonoscopy  02/10/2028   Hepatitis C Screening  Completed   HIV Screening  Completed   HPV VACCINES  Aged Out    Objective:  There were no vitals filed for this visit.  There is no height or weight on file to calculate BMI.  Physical Exam Constitutional:      Comments: He is seated in his wheelchair.  He is in good spirits.  Neurological:     Comments: No change in his chronic left-sided weakness.  Psychiatric:        Mood and Affect: Mood normal.     Lab Results Lab  Results  Component Value Date   WBC 7.7 11/26/2023   HGB 12.0 (L) 11/26/2023   HCT 36.5 (L) 11/26/2023   MCV 88.0 11/26/2023   PLT 207 11/26/2023    Lab Results  Component Value Date   CREATININE 0.73  11/26/2023   BUN 13 11/26/2023   NA 139 11/26/2023   K 4.3 11/26/2023   CL 103 11/26/2023   CO2 27 11/26/2023    Lab Results  Component Value Date   ALT 36 11/26/2023   AST 24 11/26/2023   ALKPHOS 45 11/03/2022   BILITOT 0.2 11/26/2023    Lab Results  Component Value Date   CHOL 149 11/26/2023   HDL 47 11/26/2023   LDLCALC 72 11/26/2023   TRIG 207 (H) 11/26/2023   CHOLHDL 3.2 11/26/2023   Lab Results  Component Value Date   LABRPR NON-REACTIVE 11/26/2023   HIV 1 RNA Quant (Copies/mL)  Date Value  11/26/2023 Not Detected  10/28/2022 <20 (H)  10/28/2021 Not Detected   CD4 T Cell Abs (/uL)  Date Value  10/28/2022 1,904 (H)  10/28/2021 1,854 (H)  09/11/2020 1,501     Problem List Items Addressed This Visit   None Visit Diagnoses       HIV disease (HCC)    -  Primary     Screening for STDs (sexually transmitted diseases)         Meningococcal vaccination administered at current visit         Need for tetanus booster            #hiv #reprieve -- he is on crestor 40 Doing well on increased dose of tivicay for ddi with phenetyoin Dx'ed 1990s     -discussed u=u -encourage compliance -continue current HIV medication tivicay and triumeq -labs after visit next time -f/u in 12 months    #gtc #hx tbi #left sided paresis  -continue antiepileptics   #hcm -vaccination Will administer tdap today 12/03/23 -hepatitis/std No risk will defer further screening Prior hep b vaccines given -tb screen Will review -cancer screen Defer to pcp     Raymondo Band, MD [facility] [phone] pager   [phone] cell 12/03/2023, 2:31 PM

## 2023-12-03 NOTE — Patient Instructions (Addendum)
Will give you 2 vaccines today  Tetanus    Next visit we can give you shingle vaccine   You can do labs after visit next time; no need to do before hand

## 2023-12-03 NOTE — Addendum Note (Signed)
Addended by: Tressa Busman T on: 12/03/2023 02:59 PM   Modules accepted: Orders

## 2024-01-04 ENCOUNTER — Other Ambulatory Visit: Payer: Self-pay

## 2024-01-04 DIAGNOSIS — B20 Human immunodeficiency virus [HIV] disease: Secondary | ICD-10-CM

## 2024-01-04 MED ORDER — TRIUMEQ 600-50-300 MG PO TABS: 1.0000 | ORAL_TABLET | Freq: Every day | ORAL | 5 refills | Status: DC

## 2024-01-04 MED ORDER — TIVICAY 50 MG PO TABS: 50.0000 mg | ORAL_TABLET | Freq: Every day | ORAL | 5 refills | Status: DC

## 2024-05-03 NOTE — Progress Notes (Signed)
 The 10-year ASCVD risk score (Arnett DK, et al., 2019) is: 18.1%   Values used to calculate the score:     Age: 63 years     Sex: Male     Is Non-Hispanic African American: Yes     Diabetic: No     Tobacco smoker: No     Systolic Blood Pressure: 154 mmHg     Is BP treated: Yes     HDL Cholesterol: 47 mg/dL     Total Cholesterol: 149 mg/dL  Arlon Bergamo, BSN, RN

## 2024-06-28 ENCOUNTER — Other Ambulatory Visit: Payer: Self-pay | Admitting: Internal Medicine

## 2024-06-28 DIAGNOSIS — B20 Human immunodeficiency virus [HIV] disease: Secondary | ICD-10-CM

## 2024-06-28 MED ORDER — TRIUMEQ 600-50-300 MG PO TABS: 1.0000 | ORAL_TABLET | Freq: Every day | ORAL | 4 refills | Status: DC

## 2024-11-18 ENCOUNTER — Other Ambulatory Visit: Payer: Self-pay | Admitting: Internal Medicine

## 2024-11-18 DIAGNOSIS — B20 Human immunodeficiency virus [HIV] disease: Secondary | ICD-10-CM

## 2024-11-21 MED ORDER — TRIUMEQ 600-50-300 MG PO TABS: 1.0000 | ORAL_TABLET | Freq: Every day | ORAL | 0 refills | Status: DC

## 2024-12-12 ENCOUNTER — Other Ambulatory Visit: Payer: Self-pay

## 2024-12-12 DIAGNOSIS — Z113 Encounter for screening for infections with a predominantly sexual mode of transmission: Secondary | ICD-10-CM

## 2024-12-12 DIAGNOSIS — B20 Human immunodeficiency virus [HIV] disease: Secondary | ICD-10-CM

## 2024-12-12 NOTE — Addendum Note (Signed)
 Addended by: DALILA ANNABELLA SQUIBB on: 12/12/2024 04:58 PM   Modules accepted: Orders

## 2024-12-13 ENCOUNTER — Other Ambulatory Visit: Payer: Self-pay

## 2024-12-13 ENCOUNTER — Other Ambulatory Visit

## 2024-12-13 DIAGNOSIS — Z113 Encounter for screening for infections with a predominantly sexual mode of transmission: Secondary | ICD-10-CM

## 2024-12-13 DIAGNOSIS — B20 Human immunodeficiency virus [HIV] disease: Secondary | ICD-10-CM

## 2024-12-16 ENCOUNTER — Other Ambulatory Visit: Payer: Self-pay

## 2024-12-16 ENCOUNTER — Other Ambulatory Visit: Payer: Self-pay | Admitting: Internal Medicine

## 2024-12-16 DIAGNOSIS — B20 Human immunodeficiency virus [HIV] disease: Secondary | ICD-10-CM

## 2024-12-16 LAB — COMPLETE METABOLIC PANEL WITHOUT GFR
AG Ratio: 1.5 (calc) (ref 1.0–2.5)
ALT: 58 U/L — ABNORMAL HIGH (ref 9–46)
AST: 39 U/L — ABNORMAL HIGH (ref 10–35)
Albumin: 4.3 g/dL (ref 3.6–5.1)
Alkaline phosphatase (APISO): 65 U/L (ref 35–144)
BUN: 10 mg/dL (ref 7–25)
CO2: 30 mmol/L (ref 20–32)
Calcium: 9.3 mg/dL (ref 8.6–10.3)
Chloride: 103 mmol/L (ref 98–110)
Creat: 0.76 mg/dL (ref 0.70–1.35)
Globulin: 2.9 g/dL (ref 1.9–3.7)
Glucose, Bld: 77 mg/dL (ref 65–99)
Potassium: 3.9 mmol/L (ref 3.5–5.3)
Sodium: 140 mmol/L (ref 135–146)
Total Bilirubin: 0.2 mg/dL (ref 0.2–1.2)
Total Protein: 7.2 g/dL (ref 6.1–8.1)

## 2024-12-16 LAB — CBC WITH DIFFERENTIAL/PLATELET
Absolute Lymphocytes: 3704 {cells}/uL (ref 850–3900)
Absolute Monocytes: 632 {cells}/uL (ref 200–950)
Basophils Absolute: 40 {cells}/uL (ref 0–200)
Basophils Relative: 0.5 %
Eosinophils Absolute: 32 {cells}/uL (ref 15–500)
Eosinophils Relative: 0.4 %
HCT: 40.4 % (ref 39.4–51.1)
Hemoglobin: 13.1 g/dL — ABNORMAL LOW (ref 13.2–17.1)
MCH: 28.8 pg (ref 27.0–33.0)
MCHC: 32.4 g/dL (ref 31.6–35.4)
MCV: 88.8 fL (ref 81.4–101.7)
MPV: 11.8 fL (ref 7.5–12.5)
Monocytes Relative: 7.9 %
Neutro Abs: 3592 {cells}/uL (ref 1500–7800)
Neutrophils Relative %: 44.9 %
Platelets: 203 Thousand/uL (ref 140–400)
RBC: 4.55 Million/uL (ref 4.20–5.80)
RDW: 13.1 % (ref 11.0–15.0)
Total Lymphocyte: 46.3 %
WBC: 8 Thousand/uL (ref 3.8–10.8)

## 2024-12-16 LAB — T-HELPER CELLS (CD4) COUNT (NOT AT ARMC)
Absolute CD4: 1708 {cells}/uL (ref 490–1740)
CD4 T Helper %: 48 % (ref 30–61)
Total lymphocyte count: 3547 {cells}/uL (ref 850–3900)

## 2024-12-16 LAB — LIPID PANEL
Cholesterol: 99 mg/dL
HDL: 45 mg/dL
LDL Cholesterol (Calc): 38 mg/dL
Non-HDL Cholesterol (Calc): 54 mg/dL
Total CHOL/HDL Ratio: 2.2 (calc)
Triglycerides: 79 mg/dL

## 2024-12-16 LAB — SYPHILIS: RPR W/REFLEX TO RPR TITER AND TREPONEMAL ANTIBODIES, TRADITIONAL SCREENING AND DIAGNOSIS ALGORITHM: RPR Ser Ql: NONREACTIVE

## 2024-12-16 LAB — TEST AUTHORIZATION

## 2024-12-16 LAB — HIV-1 RNA QUANT-NO REFLEX-BLD
HIV 1 RNA Quant: NOT DETECTED {copies}/mL
HIV-1 RNA Quant, Log: NOT DETECTED {Log_copies}/mL

## 2024-12-16 MED ORDER — TIVICAY 50 MG PO TABS: 50.0000 mg | ORAL_TABLET | Freq: Every day | ORAL | 0 refills | Status: DC

## 2024-12-29 ENCOUNTER — Ambulatory Visit: Payer: Self-pay | Admitting: Internal Medicine

## 2025-01-17 ENCOUNTER — Ambulatory Visit: Admitting: Internal Medicine

## 2025-01-18 ENCOUNTER — Other Ambulatory Visit: Payer: Self-pay | Admitting: Internal Medicine

## 2025-01-18 DIAGNOSIS — B20 Human immunodeficiency virus [HIV] disease: Secondary | ICD-10-CM

## 2025-01-26 ENCOUNTER — Ambulatory Visit (INDEPENDENT_AMBULATORY_CARE_PROVIDER_SITE_OTHER): Admitting: Internal Medicine

## 2025-01-26 ENCOUNTER — Encounter: Payer: Self-pay | Admitting: Internal Medicine

## 2025-01-26 ENCOUNTER — Other Ambulatory Visit: Payer: Self-pay

## 2025-01-26 VITALS — BP 158/88 | HR 81 | Temp 98.2°F

## 2025-01-26 DIAGNOSIS — B20 Human immunodeficiency virus [HIV] disease: Secondary | ICD-10-CM | POA: Diagnosis not present

## 2025-01-26 DIAGNOSIS — Z113 Encounter for screening for infections with a predominantly sexual mode of transmission: Secondary | ICD-10-CM

## 2025-01-26 DIAGNOSIS — R7989 Other specified abnormal findings of blood chemistry: Secondary | ICD-10-CM

## 2025-01-26 MED ORDER — DOLUTEGRAVIR SODIUM 50 MG PO TABS: 50.0000 mg | ORAL_TABLET | Freq: Every day | ORAL | 11 refills | Status: AC

## 2025-01-26 MED ORDER — ABACAVIR-DOLUTEGRAVIR-LAMIVUD 600-50-300 MG PO TABS: 1.0000 | ORAL_TABLET | Freq: Every day | ORAL | 11 refills | Status: AC

## 2025-01-26 NOTE — Patient Instructions (Signed)
 You are doing excellent   Continue tivicay  and triumeq    See me 11/2025 -- you can just come see me and we'll do labs during visit, no need to make extra trip

## 2025-01-26 NOTE — Progress Notes (Signed)
 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     DOLUTEGRAVIR  (TIVICAY ) 50 MG TABLET    Take 1 tablet (50 mg total) by mouth daily.   HYDROCHLOROTHIAZIDE  (HYDRODIURIL ) 50 MG TABLET    Take 1 tablet by mouth once daily   LEVETIRACETAM  (KEPPRA ) 500 MG TABLET    Take 2 tablets (1,000 mg total) by mouth 2 (two) times daily.   LOSARTAN-HYDROCHLOROTHIAZIDE  (HYZAAR) 50-12.5 MG TABLET    Take 1 tablet by mouth daily.   METFORMIN (GLUCOPHAGE) 500 MG TABLET    Take 500 mg by mouth 2 (two) times daily.   PHENYTOIN  (DILANTIN ) 100 MG ER CAPSULE    Take 1 capsule (100 mg total) by mouth 3 (three) times daily.   ROSUVASTATIN (CRESTOR) 40 MG TABLET    Take 40 mg by mouth daily.   TRIUMEQ  600-50-300 MG TABLET    Take 1 tablet by mouth once daily  Modified Medications   No medications on file  Discontinued Medications   No medications on file    Subjective: Octaviano is in for his routine HIV follow-up visit.  He denies any problems obtaining, taking or tolerating his Triumeq  or Tivicay  and has not missed any doses.  He did run out of his Dilantin  on September 23.  His neurologist's office sent him a letter stating that they would no longer see him and so he took his Dilantin  until  it ran out.  He said he was thinking that maybe he did not need to take it anymore.  However in November he started having focal seizures again leading to admission to the hospital.  Dilantin  levels were very low and his Dilantin  was restarted.  His seizures came under control and he has not had any further seizure activity since discharge.  He has a new PCP and recently received his influenza vaccine and an updated COVID-vaccine.   12/03/23 [facility] clinic f/u: Dx'ed hiv 1990s   First visit with me today Has been seeing dr Elaine Reviewed his ART -- due to phenytoin  he takes triumeq  and additional 12 hour later tivicay  His last seizure was about a year ago due to running out of antiepileptics -- he had hx traumatic brain injury as cause for seizure No health concern today He has a primary care in [place] ([place] who is practicing  out of [facility]  He has no complaint today  No missed dose tivicay /triumeq  the last 4 weeks  Reviewed labs  Patient gets around with cane/electric scooter -- tbi with residual left sided weakness   Social: -live alone -has family living in town -recreation [place] -not working -not sexually active -- defer std screening     01/26/25  id clinic f/u Last seen 11/2023 Blood tests done 11/2024 -- reviewed Mild lft elevation Cd4 around 1700 (48%) Rpr nonreactive Hiv rna ud Compliant with triumeq  and extra dose of tivicay  no missed dose last 4 weeks. Take this combination due to being on phenytoin   No health complaint today  Review of Systems: Review of Systems  Neurological:  Positive for seizures.    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   Gait disorder 04/05/2015   GERD 09/28/2007   Qualifier: Diagnosis of  By: Elaine MD, Norleen PETER 02/05/2007   Annotation: transient, while on HART Qualifier: Diagnosis of  By: Elaine MD, John     Head injury    10/1990   HIP FRACTURE, LEFT 02/05/2007   Annotation: 1/04, s/p ORIF Qualifier: Diagnosis of  By: Elaine MD, John     HIV infection [facility])    Hyperlipidemia    Hypertension    LAPAROSCOPIC MYOTOMY AND FUNDOPLICATION 03/05/2010   Annotation: 8/10 Qualifier: Diagnosis of  By: Elaine MD, John     PNEUMONIA, HX OF 02/05/2007   Annotation: with pneumothorax 1991 Qualifier: Diagnosis of  By: Elaine MD, John     Seasonal allergies    Seizure (HCC)    Seizures (HCC)    Substance abuse (HCC)    marijuana    Social History   Tobacco Use   Smoking status: Former    Current packs/day: 0.00    Average packs/day: 0.5 packs/day for 23.0 years (11.5 ttl pk-yrs)    Types: Cigarettes    Start date: 06/21/1974    Quit date: 06/21/1997    Years since quitting: 27.6   Smokeless tobacco: Never  Vaping Use   Vaping status: Never Used  Substance Use Topics   Alcohol use: No    Alcohol/week: 0.0 standard drinks of alcohol   Drug use: Yes    Frequency: 4.0 times per week    Types: Marijuana    Comment: last use on 01/18/23,updated 01/20/23    Family History  Problem Relation Age of Onset   Colon cancer Mother 43   Diabetes Mother    Hypertension Father    Stroke Father    Diabetes Sister    Diabetes Sister    Stomach cancer Sister    Colon polyps Neg Hx    Crohn's disease Neg Hx    Esophageal cancer Neg Hx    Rectal cancer Neg Hx    Ulcerative colitis Neg Hx     No Known Allergies  Health Maintenance  Topic Date Due   Medicare Annual Wellness (AWV)  Never done   Zoster Vaccines- Shingrix (1 of 2) Never done   Influenza Vaccine  07/22/2024   COVID-19 Vaccine (3 - 2025-26 season)  08/22/2024   Pneumococcal Vaccine: 50+ Years (4 of 4 - PCV20 or PCV21) 09/11/2024   Colonoscopy  02/10/2028   DTaP/Tdap/Td (2 - Td or Tdap) 12/02/2033   Hepatitis B Vaccines 19-59 Average Risk  Completed   HPV VACCINES (No Doses Required) Completed   Hepatitis C Screening  Completed   HIV Screening  Completed   Meningococcal B Vaccine  Aged Out    Objective:  Vitals:   01/26/25 1515  BP: (!) 158/88  Pulse: 81  Temp: 98.2 F (36.8 C)  TempSrc: Oral  SpO2: 97%    There is no height or weight on file to calculate BMI.  Physical Exam Constitutional:      Comments: He is seated in his wheelchair.  He is in good spirits.  Neurological:     Comments: No change in his chronic left-sided weakness.  Psychiatric:        Mood and Affect: Mood normal.     Lab Results Lab Results  Component Value Date   WBC 8.0 12/13/2024   HGB 13.1 (L) 12/13/2024   HCT 40.4 12/13/2024   MCV 88.8 12/13/2024   PLT 203 12/13/2024    Lab Results  Component Value Date   CREATININE 0.76 12/13/2024   BUN 10 12/13/2024   NA 140 12/13/2024   K 3.9 12/13/2024   CL 103 12/13/2024   CO2 30 12/13/2024    Lab Results  Component Value Date   ALT 58 (H) 12/13/2024   AST 39 (H) 12/13/2024   ALKPHOS 45 11/03/2022   BILITOT 0.2 12/13/2024    Lab Results  Component Value Date   CHOL 99 12/13/2024   HDL 45 12/13/2024   LDLCALC 38 12/13/2024   TRIG 79 12/13/2024   CHOLHDL 2.2 12/13/2024   Lab Results  Component Value Date   LABRPR NON-REACTIVE 12/13/2024   HIV 1 RNA Quant  Date Value  12/13/2024 NOT DETECTED copies/mL  11/26/2023 Not Detected Copies/mL  10/28/2022 <20 Copies/mL (H)   CD4 T Cell Abs (/uL)  Date Value  10/28/2022 1,904 (H)  10/28/2021 1,854 (H)  09/11/2020 1,501     Problem List Items Addressed This Visit   None     #hiv #reprieve -- he is on crestor 40 Takes triumeq  and subsequent 12 hr later tivicay  for ddi with phenetyoin Dx'ed 1990s -- told he had hiv when  he came in the hospital for head trauma Mode of transmission -- heterosexual transmission Cd4 nadir unknown History of OI unknown -- per patient didn't appear to have pjp or thrush At time of dx told he was doing well and didn't need treatment -- he was followed yearly after diagnosis Therapy: Triumeq /tivicay  Atripla Doesn't recall other regimen but mentioned he did take a one pill combination before atripla  01/26/25 well controlled on triumeq  & tivicay .   -discussed u=u -encourage compliance -continue current HIV medication tivicay  and triumeq  -labs next visit -f/u in 11/2025    #gtc #hx tbi #left sided paresis  -continue antiepileptics   #hcm -vaccination tdap 12/03/23 Hep b by 2004 Prevnar 2020 Patient reported had had shingle vaccine -hepatitis/std 2008 hep b sAg & sAb and hcv ab negative No risk will defer further screening Prior hep b vaccines given -tb screen Will do next visit; advise patient to do labs at clinic rather than before -cancer screen Defer to pcp     Constance ONEIDA Passer, MD [facility] for Infectious Disease [facility] [phone] pager   [phone] cell 01/26/2025, 3:28 PM  "

## 2025-11-23 ENCOUNTER — Ambulatory Visit: Payer: Self-pay | Admitting: Internal Medicine
